# Patient Record
Sex: Female | Born: 1975 | Race: White | Hispanic: No | Marital: Married | Smoking: Never smoker
Health system: Southern US, Community
[De-identification: ages and names within clinical notes are randomized; demographics above are authoritative.]

## PROBLEM LIST (undated history)

## (undated) DIAGNOSIS — E039 Hypothyroidism, unspecified: Secondary | ICD-10-CM

## (undated) DIAGNOSIS — R51 Headache: Secondary | ICD-10-CM

## (undated) DIAGNOSIS — G43009 Migraine without aura, not intractable, without status migrainosus: Principal | ICD-10-CM

## (undated) DIAGNOSIS — N289 Disorder of kidney and ureter, unspecified: Secondary | ICD-10-CM

## (undated) DIAGNOSIS — R519 Headache, unspecified: Secondary | ICD-10-CM

## (undated) HISTORY — DX: Headache, unspecified: R51.9

## (undated) HISTORY — DX: Migraine without aura, not intractable, without status migrainosus: G43.009

## (undated) HISTORY — DX: Headache: R51

## (undated) HISTORY — DX: Hypothyroidism, unspecified: E03.9

---

## 1998-05-29 HISTORY — PX: CHOLECYSTECTOMY: SHX55

## 1999-05-30 HISTORY — PX: TUBAL LIGATION: SHX77

## 2002-03-21 ENCOUNTER — Ambulatory Visit (HOSPITAL_COMMUNITY): Admission: RE | Admit: 2002-03-21 | Discharge: 2002-03-21 | Payer: Self-pay | Admitting: Obstetrics and Gynecology

## 2002-04-28 ENCOUNTER — Other Ambulatory Visit: Admission: RE | Admit: 2002-04-28 | Discharge: 2002-04-28 | Payer: Self-pay | Admitting: Obstetrics & Gynecology

## 2002-11-25 ENCOUNTER — Encounter: Payer: Self-pay | Admitting: Family Medicine

## 2002-11-25 ENCOUNTER — Ambulatory Visit (HOSPITAL_COMMUNITY): Admission: RE | Admit: 2002-11-25 | Discharge: 2002-11-25 | Payer: Self-pay | Admitting: Family Medicine

## 2003-11-08 ENCOUNTER — Emergency Department (HOSPITAL_COMMUNITY): Admission: EM | Admit: 2003-11-08 | Discharge: 2003-11-08 | Payer: Self-pay | Admitting: Emergency Medicine

## 2003-12-24 ENCOUNTER — Ambulatory Visit (HOSPITAL_COMMUNITY): Admission: RE | Admit: 2003-12-24 | Discharge: 2003-12-24 | Payer: Self-pay | Admitting: Family Medicine

## 2004-02-17 ENCOUNTER — Encounter (HOSPITAL_COMMUNITY): Admission: RE | Admit: 2004-02-17 | Discharge: 2004-02-18 | Payer: Self-pay | Admitting: Endocrinology

## 2005-03-15 ENCOUNTER — Ambulatory Visit: Payer: Self-pay | Admitting: Internal Medicine

## 2005-12-12 ENCOUNTER — Ambulatory Visit (HOSPITAL_BASED_OUTPATIENT_CLINIC_OR_DEPARTMENT_OTHER): Admission: RE | Admit: 2005-12-12 | Discharge: 2005-12-12 | Payer: Self-pay | Admitting: Pulmonary Disease

## 2005-12-17 ENCOUNTER — Ambulatory Visit: Payer: Self-pay | Admitting: Internal Medicine

## 2006-03-08 ENCOUNTER — Ambulatory Visit: Payer: Self-pay | Admitting: Internal Medicine

## 2006-03-12 ENCOUNTER — Ambulatory Visit (HOSPITAL_COMMUNITY): Admission: RE | Admit: 2006-03-12 | Discharge: 2006-03-12 | Payer: Self-pay | Admitting: Internal Medicine

## 2006-03-12 ENCOUNTER — Ambulatory Visit: Payer: Self-pay | Admitting: Internal Medicine

## 2006-10-31 ENCOUNTER — Ambulatory Visit (HOSPITAL_COMMUNITY): Admission: RE | Admit: 2006-10-31 | Discharge: 2006-10-31 | Payer: Self-pay | Admitting: Obstetrics & Gynecology

## 2007-06-20 ENCOUNTER — Ambulatory Visit (HOSPITAL_COMMUNITY): Admission: RE | Admit: 2007-06-20 | Discharge: 2007-06-20 | Payer: Self-pay | Admitting: Family Medicine

## 2007-07-07 ENCOUNTER — Emergency Department (HOSPITAL_COMMUNITY): Admission: EM | Admit: 2007-07-07 | Discharge: 2007-07-08 | Payer: Self-pay | Admitting: Emergency Medicine

## 2007-08-26 ENCOUNTER — Other Ambulatory Visit: Admission: RE | Admit: 2007-08-26 | Discharge: 2007-08-26 | Payer: Self-pay | Admitting: Obstetrics & Gynecology

## 2010-06-19 ENCOUNTER — Encounter: Payer: Self-pay | Admitting: Pulmonary Disease

## 2010-10-14 NOTE — H&P (Signed)
Monique Ramsey, Monique Ramsey                             ACCOUNT NO.:  [id number]   MEDICAL RECORD NO.:  [id number]                   PATIENT TYPE:   LOCATION:                                       FACILITY:   PHYSICIAN:  Tilda Burrow, M.D.              DATE OF BIRTH:  [birth year]   DATE OF ADMISSION:  03/21/2002  DATE OF DISCHARGE:                                HISTORY & PHYSICAL   ADMISSION DIAGNOSES:  1. Umbilical hernia with chronic moisture secondary to skin changes.  2. Leukorrhea secondary to cervical erosion.   HISTORY OF PRESENT ILLNESS:  This 35 year old female, gravida 2, para 2,  status post tubal ligation is admitted at this time for umbilical  herniorrhaphy for correction of a small bulging, umbilical hernia which has  persisted since her laparoscopic tubal ligation.  This has resulted in a  forward protrusion of the umbilicus and some skin creases within the  umbilicus which remain chronic irritated and tender.  This appears to be  chronic moisture trapping due to the changes due to the 1 cm umbilical  hernia and protrusion into what was previously an inverted deep naval.   Additionally, the patient is also to have a LEEP excision of everted  cervical tissue due to chronic leukorrhea.  She is having chronic moisture  changes in the introitus since her second child which leads to peroneal  discomfort and vaginal irritation.  The cervical eversion appears to be  secondary to changes associated with vaginal delivery.  The patient is aware  that no absolute guarantees can be given for the resolution of leukorrhea.   PAST MEDICAL HISTORY:  Benign.   PAST SURGICAL HISTORY:  Tubal ligation, laparoscopic cholecystectomy.   ALLERGIES:  No known drug allergies.   HABITS:  Iron and vitamins.   MEDICATIONS:  None.   PHYSICAL EXAMINATION:  VITAL SIGNS:  Height 5 feet 3 inches, weight 138,  blood pressure 130/80, pulse 70. Urine hCG is negative.  GENERAL:  This is an alert,  highly anxious, well-groomed Caucasian female  alert and oriented x3.  HEENT:  Pupils equal, round, and reactive to light.  NECK:  Supple. Trachea midline.  CHEST:  Clear to auscultation.  BREASTS:  Deferred.  ABDOMEN:  Nontender without masses. 1 cm umbilical hernia causing a  protrusion of the tissue from deep within the umbilicus and moisture  trapping.  PELVIC: External genitalia normal female with chronic moisture changes.  Vaginal examination with generous secretions.  Cervix multiparous and  everted.  Uterus normal size, shape, and contour with minimal descent.  Adnexa negative for masses.   ASSESSMENT:  Umbilical hernia and cervical eversion with chronic irritation  secondary to moisture changes.   PLAN:  Umbilical herniorrhaphy, LEEP excision of cervix on March 21, 2002.  Tilda Burrow, M.D.    JVF/MEDQ  D:  03/17/2002  T:  03/17/2002  Job:  [id number]

## 2010-10-14 NOTE — Procedures (Signed)
Monique Ramsey, Monique Ramsey                 ACCOUNT NO.:  [id number]   MEDICAL RECORD NO.:  [id number]          PATIENT TYPE:  OUT   LOCATION:  SLEEP CENTER                 FACILITY:  [facility]   PHYSICIAN:  Clinton D. Maple Hudson, M.D. DATE OF BIRTH:  1975-09-05   DATE OF STUDY:  12/12/2005                              NOCTURNAL POLYSOMNOGRAM   REFERRING PHYSICIAN:  Dr. Kari Baars.   DATE OF STUDY:  December 12, 2005.   INDICATION FOR STUDY:  Hypersomnia with sleep apnea.   EPWORTH SLEEPINESS SCORE:  5/24.   BMI:  26.   WEIGHT:  144 pounds.   MEDICATIONS:  Armour Thyroid.   SLEEP ARCHITECTURE:  Total sleep time 282 minutes with sleep efficiency 64%.  Stage I was 7%, stage II 76%, stages III and IV 10%, REM 7% of total sleep  time.  Sleep latency 75 minutes, REM latency 179 minutes, awake after sleep  onset 86 minutes, arousal index markedly increased at 79.6 indicating  significant sleep fragmentation.  No bedtime medication was taken.  She  estimated that she slept a total of 4 hours and required 2 hours to fall  asleep.  She described sleep quality as restless and compared overall sleep  quality on this study night to her home experience as same as usual..   RESPIRATORY DATA:  Apnea/hypopnea index (AHI, RDI) 0.6 obstructive events  per hour which is within normal limits (normal range 0/5 per hour).  There  were 3 hypopneas.  All events were recorded while sleeping supine.  REM AHI  6.3 per hour.   OXYGEN DATA:  Mild snoring with oxygen desaturation to a nadir of 92%.  Mean  oxygen saturation through the study was 96% on room air.   CARDIAC DATA:  Normal sinus rhythm.   MOVEMENT/PARASOMNIA:  A total of 34 limb jerks were recorded of which 8 were  associated with arousal or awakening for a periodic limb movement with  arousal index of 1.7 per hour which is probably insignificant.  No bathroom  trips.   IMPRESSION/RECOMMENDATIONS:  1.  Short and fragmented sleep with some delay in  initiating sustained sleep      until nearly 12:30 a.m.  Note that she describes overall sleep quality      as same as usual suggesting that management as an insomnia may be of      some benefit.  2.  A few obstructive respiratory events were noted, AHI 0.6 per hour.      These do not appear to be clinically significant and were associated      with mild snoring and normal oxygenation.      Clinton D. Maple Hudson, M.D.  Diplomate, Biomedical engineer of Sleep Medicine  Electronically Signed     CDY/MEDQ  D:  12/17/2005 10:16:46  T:  12/17/2005 22:17:02  Job:  [id number]

## 2010-10-14 NOTE — Op Note (Signed)
Monique Ramsey, Monique Ramsey                 ACCOUNT NO.:  [id number]   MEDICAL RECORD NO.:  [id number]          PATIENT TYPE:  AMB   LOCATION:  DAY                           FACILITY:  [facility]   PHYSICIAN:  Lionel December, M.D.    DATE OF BIRTH:  1975-10-23   DATE OF PROCEDURE:  03/12/2006  DATE OF DISCHARGE:                                 OPERATIVE REPORT   PROCEDURE:  Esophagogastroduodenoscopy with placement of Bravo device for pH  monitoring.   INDICATION:  [name] is 35 year old Caucasian female who has bitter taste  coating on her tongue on waking up as well as nausea and gnawing feeling in  her epigastric area.  She is presumed to have GERD and she has been on  therapy but without symptomatic improvement.  She has had these symptoms for  5 years.  She is undergoing diagnostic EGD.  Unless she has evidence of  complicated esophagitis, we will proceed with pH study with Bravo device.  The procedure and risks were reviewed the patient, informed consent was  obtained.  Please note that she has had cholecystectomy before.   MEDS FOR CONSCIOUS SEDATION:  Benzocaine spray pharyngeal topical  anesthesia, Demerol 50 mg IV, Versed 10 mg IV.   FINDINGS:  Procedure performed in endoscopy suite.  The patient's vital  signs and O2 saturation were monitored during procedure and remained stable.  The patient was placed left lateral position and Olympus videoscope was  passed via oropharynx without any difficulty into esophagus.   Esophagus. Mucosa of the esophagus is normal except there was focal erythema  at GE junction.  GE junction of 33 cm and hiatus was at 35.  She had a 2-cm  long sliding hiatal hernia.   Stomach was empty and distended very well insufflation.  Folds of proximal  stomach were normal.  Examination of mucosa at body, antrum, pyloric channel  as well as angularis, fundus and cardia was normal.   Duodenum. Bulbar mucosa was normal.  Scope was passed to second part of the  duodenum where mucosa and folds were normal.  Endoscope was withdrawn.   We decided to proceed with placement of Bravo device.  It was calibrated.  It was already loaded onto delivery catheter.  It was passed blindly via  oropharynx into esophagus to length of 27 cm from the incisors.  Delivery  catheter was connected to suction device for 30 seconds.  Plunger was pushed  in order to secure the device to esophageal mucosa.  It was then rotated  clockwise and gradually withdrawn.  Endoscope was passed again and Bravo  device was noted to be in good position connected to mucosa.  Pictures taken  for the record.  Endoscope was withdrawn.   The patient tolerated the procedure well.   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  Next small sliding hiatal hernia.   No evidence of peptic ulcer disease or gastritis.   Bravo device placed 6 cm proximal to GE junction.   RECOMMENDATIONS:  She will resume her usual diet and  activity except she  will not drive today.  She will stay off her PPI.  Symptom diary as suggested.  She will return to  this facility in 48 hours.      Lionel December, M.D.  Electronically Signed     NR/MEDQ  D:  03/12/2006  T:  03/13/2006  Job:  [id number]   cc:   Patrica Duel, M.D.  Fax: [phone]   Lazaro Arms, M.D.  Fax: [phone]

## 2010-10-14 NOTE — Op Note (Signed)
Monique Ramsey, Monique Ramsey                             ACCOUNT NO.:  [id number]   MEDICAL RECORD NO.:  [id number]                   PATIENT TYPE:  AMB   LOCATION:  DAY                                  FACILITY:  [facility]   PHYSICIAN:  Tilda Burrow, M.D.              DATE OF BIRTH:  May 12, 1976   DATE OF PROCEDURE:  DATE OF DISCHARGE:  03/21/2002                                 OPERATIVE REPORT   PREOPERATIVE DIAGNOSES:  Umbilical hernia and also cervical eversion.   POSTOPERATIVE DIAGNOSES:  Umbilical hernia and also cervical eversion.   OPERATION/PROCEDURE:  Umbilical herniorrhaphy, LEEP excision of  transformation sounded cervix.   ASSISTANT:  None.   ANESTHESIA:  General.   COMPLICATIONS:  None.   ESTIMATED BLOOD LOSS:  50 cc.   INDICATIONS FOR PROCEDURE:  A 35 year old female with complaints of chronic  moisture capping in the umbilicus resulting from a small protuberant 1 cm  umbilical hernia that had developed after a previous cholecystectomy.  Additionally, the patient has had persistent leukorrhea which was  attributable to her cervical eversion.   DESCRIPTION OF PROCEDURE:  The patient was taken to the operating room and  prepped for an abdominal procedure first.  We then proceeded with a  subumbilical incision circumferential in location which elevated the skin  flap of the umbilicus.  The rim of the fibrous umbilical hernia could be  identified.  The fatty tissue protruding through the hernia defect was  identifiable.  It was reducible without entering the peritoneal cavity.  Alice clamps were placed on the upper and lower aspects of the umbilical  hernia fascial defect, and then the series of permanent interrupted 0  Prolene was placed over the umbilicus to close the fascial defect.  The  subcutaneous tissues were then reapproximated with 4-0 Dexon to invert the  umbilicus into its normal inward position, the skin edges then closed using  subcuticular 4-0 Dexon.  The  patient tolerated the procedure well.   The patient was then repositioned with the legs elevated in the yellow fin  lithotomy stirrups, the speculum inserted and the leg plate confirmed as  being clotted properly.  With the patient already asleep, it was easy to  then proceed with loupe excision of the cervix using a two pass technique,  removing the surface tissues of the cervix anteriorly  and posteriorly.  These were then sent for histologic confirmation.  Monsel  solution was applied to the cervix to complete hemostasis.  The patient  tolerated the procedure well and went to the recovery room in good condition  for outpatient discharge after meeting discharge criteria.  Tilda Burrow, M.D.    JVF/MEDQ  D:  04/27/2002  T:  04/27/2002  Job:  [id number]

## 2010-10-14 NOTE — Op Note (Signed)
NAMEABBEGAYLE, DENAULT                 ACCOUNT NO.:  [id number]   MEDICAL RECORD NO.:  [id number]          PATIENT TYPE:  AMB   LOCATION:  DAY                           FACILITY:  [facility]   PHYSICIAN:  Lionel December, M.D.    DATE OF BIRTH:  1975/09/23   DATE OF PROCEDURE:  DATE OF DISCHARGE:  03/12/2006                                 OPERATIVE REPORT   PROCEDURE:  Esophageal pH monitoring with the Bravo device.   INDICATIONS:  Monique Ramsey is a 35 year old Caucasian female with chronic symptoms  of GERD, who has not responded to therapy.  She has failed Nexium, Aciphex,  Zegerid, and Protonix.  She had esophagogastroduodenoscopy on 03/12/2006  revealing mild changes of reflux esophagitis limited to GE junction and a  small sliding hiatal hernia.  Bravo device was placed at that time.   FINDINGS:  Day one analysis study duration is 23 hours and 5 minutes.  Number of reflux episodes 61, 50 of which occurred in upright position.   Number of reflux episodes greater than 5 minutes is four, three of which  occurred in upright position.   Duration of longest reflux episode is 10 minutes occurring in upright  position.   Time pH less than 4 is 73 minutes.   Fraction time pH less than 4 is 5.3%.   Day two analysis study duration 22 hours and 16 minutes.   Number of reflux episodes 52, 45 of which occurred in upright position.   Number of reflux episodes greater than 5 minutes is five, four of which  occurred in upright position.   Duration of longest reflux episode 15 minutes occurring postprandial.   Time pH below for 58 minutes.   Fraction time pH below 4 is 4.3%.   Combined 2-day analysis.   Number of reflux episodes is 112, 94 which occurred in upright position.   Number of reflux episodes greater than 5 minutes is nine.   Duration of longest reflux episode 15 minutes.   Time pH below 4 is 131 minutes.   Fraction time pH below 4 is 4.8%.   Symptom diary.  The patient only  reported one episode of heartburn but no  acid was documented with her symptom.   FINAL IMPRESSION:  This is an abnormal study.  The patient is experiencing  esophageal acid exposure in abnormal or pathologic range.  However, she did  not write much in her symptom diary.  She only recorded one episode of  heartburn which was not associated with esophageal acid exposure.   RECOMMENDATIONS:  We will try her on Prevacid 30 mg b.i.d. and Reglan at  least for the short duration.  These recommendation will be made when I can  contact the patient (the patient was called but she is not home).      Lionel December, M.D.  Electronically Signed     NR/MEDQ  D:  03/19/2006  T:  03/20/2006  Job:  [id number]   cc:   Patrica Duel, M.D.  Fax: [phone]   Lazaro Arms, M.D.  Fax: [phone]

## 2010-10-14 NOTE — H&P (Signed)
NAME:  Monique Ramsey, Monique Ramsey                 ACCOUNT NO.:  [id number]   MEDICAL RECORD NO.:  [id number]          PATIENT TYPE:  AMB   LOCATION:  DAY                           FACILITY:  [facility]   PHYSICIAN:  Lionel December, M.D.    DATE OF BIRTH:  August 05, 1975   DATE OF ADMISSION:  DATE OF DISCHARGE:  LH                                HISTORY & PHYSICAL   CHIEF COMPLAINT:  Monique Ramsey is a 35 year old Caucasian female who we saw last  year with over a 5 year history of presumed acid reflux.  She wakes up every  morning with nausea and a gnawing type feeling in her stomach.  She also has  a bitter taste in her mouth and is able to scrape a white coating off of her  tongue.  She states she has to eat almost immediately when she wakes up in  order to feel better.  Throughout the day she notices similar symptoms when  her stomach is empty, but it is not as prominent.  She denies any typical  heartburn symptoms, dysphagia, odynophagia.  She has chronic postprandial  fecal urgency several times a week especially when she eats out at [employer]. This is associated with lower abdominal cramping.  She has had  problems with her menstrual cycle with too little flow.  She has had  spotting and is being followed by Dr. Despina Hidden.   CURRENT MEDICATIONS:  1. Multivitamin daily.  2. Thyroid armour 60 mg daily.   ALLERGIES:  NO KNOWN DRUG ALLERGIES.   PAST MEDICAL HISTORY:  Hypothyroidism.   PAST SURGICAL HISTORY:  1. Cholecystectomy during pregnancy at [redacted] weeks gestation for sludge.  2. History of tubal ligation.  3. Umbilical hernia repair in 2003.   FAMILY HISTORY:  Mother and father are both alive and well.  She has a  brother who is alive and well.   SOCIAL HISTORY:  She is married and has two children.  She is a housewife.  Has never been a smoker.  No alcohol use.   REVIEW OF SYSTEMS:  See HPI for GI.  CONSTITUTIONAL:  Her weight is up 7  pounds since we saw her last year.  CARDIOPULMONARY:  No chest  pain or  shortness of breath.  See HPI for GU.   PHYSICAL EXAMINATION:  Weight 149, height 5 foot 3, temperature 98.9, blood  pressure 116/70, pulse 88.  GENERAL:  A pleasant, well-nourished, well-developed Caucasian female in no  acute distress.  SKIN:  Warm and dry.  No jaundice.  HEENT:  Pupils equal, round, reactive to light.  Conjunctiva are pink.  Sclerae nonicteric.  Oropharyngeal mucosa moist and pink.  No lesions,  erythema or exudates.  No lymphadenopathy.  CHEST:  Lungs clear to auscultation.  CARDIAC:  Reveals a regular rate and rhythm.  No murmur, rub or gallop.  ABDOMEN:  Positive bowel sounds, soft, nontender, nondistended.  No  organomegaly or masses.  No rebound tenderness or guarding.  No abdominal  hernias.   IMPRESSION:  Monique Ramsey is a 35 year old lady with over a 5 year history  of  early a.m. nausea, epigastric gnawing with empty stomach, bitter taste in  her mouth especially in the mornings as well as a white coating on her  tongue.  She has been tried with two week courses of various PPI's in the  past including Nexium, Aciphex, Zegerid and Protonix without noted  improvement of her symptoms.  Last year we were planning on EGD with Bravo  placement to help figure out the cause of her symptoms.  She is back now to  have this scheduled.  She also has IBS with mild postprandial symptoms.   PLAN:  EGD with possible Bravo placement off of antacids or PPI therapy in  the near future with Dr. Karilyn Cota.      Tana Coast, P.A.      Lionel December, M.D.  Electronically Signed    LL/MEDQ  D:  03/08/2006  T:  03/09/2006  Job:  [id number]

## 2010-10-26 ENCOUNTER — Other Ambulatory Visit (HOSPITAL_COMMUNITY): Payer: Self-pay | Admitting: Internal Medicine

## 2010-10-26 DIAGNOSIS — R209 Unspecified disturbances of skin sensation: Secondary | ICD-10-CM

## 2010-10-28 ENCOUNTER — Other Ambulatory Visit (HOSPITAL_COMMUNITY): Payer: Self-pay | Admitting: Internal Medicine

## 2010-10-28 ENCOUNTER — Ambulatory Visit (HOSPITAL_COMMUNITY)
Admission: RE | Admit: 2010-10-28 | Discharge: 2010-10-28 | Disposition: A | Discharge status: Home / Self Care | Payer: 59 | Source: Ambulatory Visit | Attending: Internal Medicine | Admitting: Internal Medicine

## 2010-10-28 DIAGNOSIS — R209 Unspecified disturbances of skin sensation: Secondary | ICD-10-CM | POA: Insufficient documentation

## 2010-10-28 DIAGNOSIS — R930 Abnormal findings on diagnostic imaging of skull and head, not elsewhere classified: Secondary | ICD-10-CM | POA: Insufficient documentation

## 2010-10-28 MED ORDER — GADOBENATE DIMEGLUMINE 529 MG/ML IV SOLN: 15.0000 mL | Freq: Once | INTRAVENOUS | Status: AC | PRN

## 2011-02-17 LAB — URINALYSIS, ROUTINE W REFLEX MICROSCOPIC
Glucose, UA: NEGATIVE
Ketones, ur: NEGATIVE
Leukocytes, UA: NEGATIVE
Protein, ur: NEGATIVE
Urobilinogen, UA: 0.2

## 2011-02-17 LAB — URINE MICROSCOPIC-ADD ON

## 2011-02-17 LAB — PREGNANCY, URINE: Preg Test, Ur: NEGATIVE

## 2012-05-29 HISTORY — PX: OTHER SURGICAL HISTORY: SHX169

## 2013-10-29 ENCOUNTER — Other Ambulatory Visit (HOSPITAL_COMMUNITY): Payer: Self-pay | Admitting: Family Medicine

## 2013-10-29 DIAGNOSIS — R1011 Right upper quadrant pain: Secondary | ICD-10-CM

## 2013-10-31 ENCOUNTER — Other Ambulatory Visit (HOSPITAL_COMMUNITY): Payer: 59

## 2013-11-03 ENCOUNTER — Encounter (HOSPITAL_COMMUNITY): Payer: Self-pay

## 2013-11-03 ENCOUNTER — Ambulatory Visit (HOSPITAL_COMMUNITY)
Admission: RE | Admit: 2013-11-03 | Discharge: 2013-11-03 | Disposition: A | Discharge status: Home / Self Care | Payer: BC Managed Care – PPO | Source: Ambulatory Visit | Attending: Family Medicine | Admitting: Family Medicine

## 2013-11-03 DIAGNOSIS — N200 Calculus of kidney: Secondary | ICD-10-CM | POA: Insufficient documentation

## 2013-11-03 DIAGNOSIS — R1011 Right upper quadrant pain: Secondary | ICD-10-CM

## 2013-11-03 DIAGNOSIS — R109 Unspecified abdominal pain: Secondary | ICD-10-CM | POA: Insufficient documentation

## 2013-11-03 MED ORDER — IOHEXOL 300 MG/ML  SOLN
100.0000 mL | Freq: Once | INTRAMUSCULAR | Status: AC | PRN
  Administered 2013-11-03: 100 mL via INTRAVENOUS

## 2014-04-01 ENCOUNTER — Encounter: Payer: Self-pay | Admitting: Neurology

## 2014-04-01 ENCOUNTER — Other Ambulatory Visit: Payer: Self-pay | Admitting: Neurology

## 2014-04-01 ENCOUNTER — Ambulatory Visit (INDEPENDENT_AMBULATORY_CARE_PROVIDER_SITE_OTHER): Payer: BC Managed Care – PPO | Admitting: Neurology

## 2014-04-01 VITALS — BP 129/85 | HR 77 | Ht 62.0 in | Wt 153.8 lb

## 2014-04-01 DIAGNOSIS — G43009 Migraine without aura, not intractable, without status migrainosus: Secondary | ICD-10-CM

## 2014-04-01 DIAGNOSIS — G4710 Hypersomnia, unspecified: Secondary | ICD-10-CM

## 2014-04-01 DIAGNOSIS — G4730 Sleep apnea, unspecified: Secondary | ICD-10-CM

## 2014-04-01 DIAGNOSIS — R9089 Other abnormal findings on diagnostic imaging of central nervous system: Secondary | ICD-10-CM

## 2014-04-01 DIAGNOSIS — R930 Abnormal findings on diagnostic imaging of skull and head, not elsewhere classified: Secondary | ICD-10-CM

## 2014-04-01 HISTORY — DX: Migraine without aura, not intractable, without status migrainosus: G43.009

## 2014-04-01 NOTE — Progress Notes (Signed)
Reason for visit: headache  Monique Ramsey is a 38 y.o. female  History of present illness:  Monique Ramsey is a 38 year old right-handed white female with a history of headaches that began approximately one year ago, but the headaches have become a bit more frequent over the last 2 or 3 months. The headache frequency is about one every 2 weeks, and the patient does not know of any activating factors for the headache. The patient indicates that the headaches are usually in the occipital areas associated with some throbbing sensations, and some nausea without vomiting. The patient denies any visual field changes, and she denies any numbness or weakness of the face, arms, or legs. The patient does have some photophobia without phonophobia. The patient indicates that sleep will help the headache. The headache may last for 4 or 5 hours at a time. The patient does report some decreased cognitive abilities during the headache. She also reports a long-standing history of hypersomnolence, and she indicates that she needs at least 12 hours of sleep a day, but when she wakes up, she still feels sleepy, hot, and has general malaise for 2 or 3 hours. The patient feels fatigued throughout the day. She underwent a sleep study several years ago, but she never slept during the study. The patient has not been told that she snores at night. She is having difficulty staying awake during the day. She reports frequent early morning headaches that will go away within one hour or so after awakening. These headaches are in the frontal areas, not occipital regions. The patient has had MRI evaluation of the brain done in 2012 that showed mild white matter changes. She is sent to this office for an evaluation. There is no family history of hypersomnolence or a history of migraine headaches.  Past Medical History  Diagnosis Date  . Headache   . Hypothyroidism   . 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 Cholecystectomy  2000  . Tubal ligation  2001  . Fallopian tube removal  2014    Family History  Problem Relation Age of Onset  . Multiple sclerosis Mother   . Diabetes Mellitus I Mother   . Migraines Neg Hx     Social history:  reports that she has never smoked. She has never used smokeless tobacco. She reports that she does not drink alcohol or use illicit drugs.  Medications:  No current outpatient prescriptions on file prior to visit.   No current facility-administered medications on file prior to visit.     No Known Allergies  ROS:  Out of a complete 14 system review of symptoms, the patient complains only of the following symptoms, and all other reviewed systems are negative.  Headache Too much sleep Sleepiness   Blood pressure 129/85, pulse 77, height 5\' 2"  (1.575 m), weight 153 lb 12.8 oz (69.763 kg).  Physical Exam  General: The patient is alert and cooperative at the time of the examination.The patient is minimally obese.  Eyes: Pupils are equal, round, and reactive to light. Discs are flat bilaterally.  Neck: The neck is supple, no carotid bruits are noted.  Respiratory: The respiratory examination is clear.  Cardiovascular: The cardiovascular examination reveals a regular rate and rhythm, no obvious murmurs or rubs are noted.  Neuromuscular: Range of movement of the cervical spine is full.  Skin: Extremities are without significant edema.  Neurologic Exam  Mental status: The patient  is alert and oriented x 3 at the time of the examination. The patient has apparent normal recent and remote memory, with an apparently normal attention span and concentration ability.  Cranial nerves: Facial symmetry is present. There is good sensation of the face to pinprick and soft touch bilaterally. The strength of the facial muscles and the muscles to head turning and shoulder shrug are normal bilaterally. Speech is well  enunciated, no aphasia or dysarthria is noted. Extraocular movements are full. Visual fields are full. The tongue is midline, and the patient has symmetric elevation of the soft palate. No obvious hearing deficits are noted.  Motor: The motor testing reveals 5 over 5 strength of all 4 extremities. Good symmetric motor tone is noted throughout.  Sensory: Sensory testing is intact to pinprick, soft touch, vibration sensation, and position sense on all 4 extremities. No evidence of extinction is noted.  Coordination: Cerebellar testing reveals good finger-nose-finger and heel-to-shin bilaterally.  Gait and station: Gait is normal. Tandem gait is normal. Romberg is negative. No drift is seen.  Reflexes: Deep tendon reflexes are symmetric and normal bilaterally. Toes are downgoing bilaterally.   MRI brain 10/28/10:  IMPRESSION: Mild but abnormal cerebral white matter signal without enhancement, predominately in the left hemisphere. This is nonspecific. In this age group primary considerations are demyelinating disease (including multiple sclerosis), sequelae of trauma, hypercoagulable state, vasculitis, migraines, or infection (including Lyme disease).   Assessment/Plan:  1. Migraine headache, common migraine  2. Reported hypersomnolence  3. History of abnormal MRI of the brain  The patient has been given a prescription for Imitrex, and this is a reasonable approach to her current headaches. The headaches are occurring only once or twice a month, and there is no indication for a prophylactic medication for the headache. The patient is reporting a long-standing history of hypersomnolence with early morning headaches that could represent sleep apnea. The patient will be sent for another sleep study. She indicates that she had difficulty getting to sleep with the prior study done several years ago. The patient will be sent for a repeat MRI of the brain to compare to the prior study done in  2012 to follow-up for the white matter lesions seen previously. The patient will follow-up in 4 months.  Monique Ramsey. Monique Dede Dobesh MD 04/01/2014 7:09 PM  [facility] [address] [place] [id number]  Phone [phone] Fax [phone]

## 2014-04-01 NOTE — Patient Instructions (Signed)

## 2014-04-06 ENCOUNTER — Telehealth: Payer: Self-pay | Admitting: Neurology

## 2014-04-06 NOTE — Telephone Encounter (Signed)
Sleep study request review: This patient has an underlying medical history of migraine headaches, hypothyroidism and overweight state and is referred by Dr. Anne HahnWillis for an attended sleep study due to a report of a several year history of hypersomnolence, needing more than 12 hours of sleep and not waking up rested. His note indicates that she had a prior sleep study done which was nonrevealing. I would like to see the patient in clinic before ordering a sleep study to get more sleep related information and see if we are dealing with a hypersomnolence disorder, rather than a sleep disordered breathing disorder. Snoring was not reported in his note. Please arrange for a sleep clinic appointment.    Monique FoleySaima Jenalyn Girdner, MD, PhD [facility])

## 2014-04-06 NOTE — Telephone Encounter (Signed)
Dr. Anne HahnWillis, refers patient for attended sleep study.  Height: 5'2"  Weight: 153 lbs 12.8 oz  BMI: 28.12  Past Medical History:  Headache    . Hypothyroidism   . Common migraine without intractability 04/01/2014     Sleep Symptoms: She also reports a long-standing history of hypersomnolence, and she indicates that she needs at least 12 hours of sleep a day, but when she wakes up, she still feels sleepy, hot, and has general malaise for 2 or 3 hours. The patient feels fatigued throughout the day. She underwent a sleep study several years ago, but she never slept during the study. The patient has not been told that she snores at night. She is having difficulty staying awake during the day. She reports frequent early morning headaches that will go away within one hour or so after awakening.    Epworth Score: Unable to reach the patient    Medication:  Ergocalciferol (Cap) DRISDOL 1610950000 UNITS 50,000 Units once a week.       Levothyroxine Sodium (Tab) SYNTHROID 75 MCG 75 mcg daily.      SUMAtriptan Succinate (Tab) IMITREX 50 MG 50 mg as needed. Maximum dose is 200mg  daily      Ins: [employer]   Assessment & Plan:   1. Migraine headache, common migraine  2. Reported hypersomnolence  3. History of abnormal MRI of the brain  The patient has been given a prescription for Imitrex, and this is a reasonable approach to her current headaches. The headaches are occurring only once or twice a month, and there is no indication for a prophylactic medication for the headache. The patient is reporting a long-standing history of hypersomnolence with early morning headaches that could represent sleep apnea. The patient will be sent for another sleep study. She indicates that she had difficulty getting to sleep with the prior study done several years ago. The patient will be sent for a repeat MRI of the brain to compare to the prior study done in 2012 to follow-up for the white matter lesions  seen previously. The patient will follow-up in 4 months.  Please review patient information and submit instructions for scheduling and orders for sleep technologist. Thank you.

## 2014-04-07 DIAGNOSIS — R51 Headache: Secondary | ICD-10-CM

## 2014-04-10 ENCOUNTER — Telehealth: Payer: Self-pay | Admitting: Neurology

## 2014-04-10 ENCOUNTER — Other Ambulatory Visit: Payer: Self-pay | Admitting: Neurology

## 2014-04-10 DIAGNOSIS — R9089 Other abnormal findings on diagnostic imaging of central nervous system: Secondary | ICD-10-CM

## 2014-04-10 DIAGNOSIS — G4710 Hypersomnia, unspecified: Secondary | ICD-10-CM

## 2014-04-10 DIAGNOSIS — G4730 Sleep apnea, unspecified: Secondary | ICD-10-CM

## 2014-04-10 DIAGNOSIS — G43009 Migraine without aura, not intractable, without status migrainosus: Secondary | ICD-10-CM

## 2014-04-10 NOTE — Telephone Encounter (Signed)
I called the patient. The MRI of the brain is stable from 6/12.   MRI brain 04/10/14:  IMPRESSION: Slightly abnormal MRI scan of the brain showing subtle left frontal periventricular white matter hyperintensities with differential discussed above. No enhancing lesions are noted. Overall no significant change compared with prior MRI scan dated 10/28/2010

## 2014-07-06 ENCOUNTER — Ambulatory Visit (INDEPENDENT_AMBULATORY_CARE_PROVIDER_SITE_OTHER): Payer: BLUE CROSS/BLUE SHIELD | Admitting: Neurology

## 2014-07-06 ENCOUNTER — Encounter: Payer: Self-pay | Admitting: Neurology

## 2014-07-06 VITALS — BP 113/82 | HR 87 | Temp 99.3°F | Ht 62.0 in | Wt 152.0 lb

## 2014-07-06 DIAGNOSIS — G478 Other sleep disorders: Secondary | ICD-10-CM

## 2014-07-06 DIAGNOSIS — G4710 Hypersomnia, unspecified: Secondary | ICD-10-CM

## 2014-07-06 DIAGNOSIS — G479 Sleep disorder, unspecified: Secondary | ICD-10-CM

## 2014-07-06 DIAGNOSIS — R51 Headache: Secondary | ICD-10-CM

## 2014-07-06 DIAGNOSIS — R519 Headache, unspecified: Secondary | ICD-10-CM

## 2014-07-06 DIAGNOSIS — G43009 Migraine without aura, not intractable, without status migrainosus: Secondary | ICD-10-CM

## 2014-07-06 NOTE — Patient Instructions (Signed)
We will do sleep testing for your excessive sleep at night. We will do a night time sleep study followed by a daytime sleep test, for scheduled nap testing.

## 2014-07-06 NOTE — Progress Notes (Signed)
Subjective:    Patient ID: Monique Ramsey is a 39 y.o. female.  HPI     Huston Foley, MD, PhD [facility] [address], [address] [address] [place], [place] [address]  Dear Monique Ramsey,   I saw your patient, Monique Ramsey, upon your kind request in my clinic today for initial consultation of her sleep disorder, in particular her excessive daytime somnolence. The patient is unaccompanied today. As you know, Monique Ramsey is a 39 year old right-handed woman with an underlying medical history of hypothyroidism, vitamin D deficiency, migraine headaches, and overweight state, who reports a long-standing history of sleeping excessively at night. Left her own devices she would easily sleep 12 hours or more each night if she could. She still doesn't wake up rested. Usually she goes to bed around 10 PM and falls asleep within 15 minutes. She is a restless sleeper. She denies restless leg symptoms and frank leg twitching in her sleep. She is not aware of any significant snoring but has woken herself up with a sense of gasping for air. Her husband has never noted any pauses in her breathing. She has no family history of narcolepsy or obstructive sleep apnea. She denies any cataplexy, sleep paralysis, hypnagogic or hypnopompic hallucinations, and denies any parasomnias. She is tired during the day. She's not in the habit of taking any naps. She has never fallen asleep while driving. She denies any sleep attacks during the day. Her biggest complaint is that she sleeps too much at night and never wakes up rested. She denies nocturia but has morning headaches on occasion. Her Epworth sleepiness score is 10 out of 24. She denies depression and is not on any anxiety medication or antidepressant. She has been on prescription strength vitamin D, Synthroid and Imitrex as needed for her migraines. She had a sleep study some 7-10 years ago and says that she was told she did not sleep enough at night. She did not  have a nap study at the time. She drinks 2 cans of soda per day. She does not smoke or drink alcohol.  Her Past Medical History Is Significant For: Past Medical History  Diagnosis Date  . Headache   . Hypothyroidism   . Common migraine without intractability 04/01/2014    Her Past Surgical History Is Significant For: Past Surgical History  Procedure Laterality Date  . Cholecystectomy  2000  . Tubal ligation  2001  . Fallopian tube removal  2014    Her Family History Is Significant For: Family History  Problem Relation Age of Onset  . Multiple sclerosis Mother   . Diabetes Mellitus I Mother   . Migraines Neg Hx     Her Social History Is Significant For: History   Social History  . Marital Status: Married    Spouse Name: [name]    Number of Children: 2  . Years of Education: 12   Occupational History  .      not employed   Social History Main Topics  . Smoking status: Never Smoker   . Smokeless tobacco: Never Used  . Alcohol Use: No  . Drug Use: No  . Sexual Activity: None   Other Topics Concern  . None   Social History Narrative   Patient drinks 2-3 caffeine sodas a day.     Her Allergies Are:  No Known Allergies:   Her Current Medications Are:  Outpatient Encounter Prescriptions as of 07/06/2014  Medication Sig  . SUMAtriptan (IMITREX) 50 MG tablet  50 mg as needed. Maximum dose is 200mg  daily  . SYNTHROID 75 MCG tablet 75 mcg daily.  . Vitamin D, Ergocalciferol, (DRISDOL) 50000 UNITS CAPS capsule 50,000 Units once a week.  :  Review of Systems:  Out of a complete 14 point review of systems, all are reviewed and negative with the exception of these symptoms as listed below:   Review of Systems Too much sleep, non-restorative sleep, morning headaches.   Objective:  Neurologic Exam  Physical Exam Physical Examination:   Filed Vitals:   07/06/14 1430  BP: 113/82  Pulse: 87  Temp: 99.3 F (37.4 C)    General Examination: The patient is a very  pleasant 39 y.o. female in no acute distress. She appears well-developed and well-nourished and very well groomed.   HEENT: Normocephalic, atraumatic, pupils are equal, round and reactive to light and accommodation. Funduscopic exam is normal with sharp disc margins noted. Extraocular tracking is good without limitation to gaze excursion or nystagmus noted. Normal smooth pursuit is noted. Hearing is grossly intact. Tympanic membranes are clear bilaterally. Face is symmetric with normal facial animation and normal facial sensation. Speech is clear with no dysarthria noted. There is no hypophonia. There is no lip, neck/head, jaw or voice tremor. Neck is supple with full range of passive and active motion. There are no carotid bruits on auscultation. Oropharynx exam reveals: mild mouth dryness, good dental hygiene and mild airway crowding, due to narrow airway entry and tonsils in place. She has mild pharyngeal erythema. Mallampati is class II. Neck circumference is 13 inches.  Chest: Clear to auscultation without wheezing, rhonchi or crackles noted.  Heart: S1+S2+0, regular and normal without murmurs, rubs or gallops noted.   Abdomen: Soft, non-tender and non-distended with normal bowel sounds appreciated on auscultation.  Extremities: There is no pitting edema in the distal lower extremities bilaterally. Pedal pulses are intact.  Skin: Warm and dry without trophic changes noted. There are no varicose veins.  Musculoskeletal: exam reveals no obvious joint deformities, tenderness or joint swelling or erythema.   Neurologically:  Mental status: The patient is awake, alert and oriented in all 4 spheres. Her immediate and remote memory, attention, language skills and fund of knowledge are appropriate. There is no evidence of aphasia, agnosia, apraxia or anomia. Speech is clear with normal prosody and enunciation. Thought process is linear. Mood is normal and affect is normal.  Cranial nerves II - XII are  as described above under HEENT exam. In addition: shoulder shrug is normal with equal shoulder height noted. Motor exam: Normal bulk, strength and tone is noted. There is no drift, tremor or rebound. Romberg is negative. Reflexes are 2+ throughout. Babinski: Toes are flexor bilaterally. Fine motor skills and coordination: intact with normal finger taps, normal hand movements, normal rapid alternating patting, normal foot taps and normal foot agility.  Cerebellar testing: No dysmetria or intention tremor on finger to nose testing. Heel to shin is unremarkable bilaterally. There is no truncal or gait ataxia.  Sensory exam: intact to light touch, pinprick, vibration, temperature sense in the upper and lower extremities.  Gait, station and balance: She stands easily. No veering to one side is noted. No leaning to one side is noted. Posture is age-appropriate and stance is narrow based. Gait shows normal stride length and normal pace. No problems turning are noted. She turns en bloc. Tandem walk is unremarkable.               Assessment and Plan:  In summary, ANAIYAH ANGLEMYER is a very pleasant 39 y.o.-year old female with an underlying medical history of hypothyroidism, vitamin D deficiency, migraine headaches, and overweight state, who reports a long-standing history of sleeping excessively at night. Her history is suggestive of a sleepiness or hypersomnolence disorder but not telltale for narcolepsy. Nevertheless, she can sleep 12 hours or more each night but never wakes up rested. She has had these problems for at least 7 or maybe 10 years. Her physical exam is nonfocal and she was reassured in that regard.  At this juncture I would like for the patient  come back for further testing in the form of nocturnal polysomnogram followed by a nap study the next day. I explained her sleep test procedures to her in detail. She does not have to wean any medications at this time. I will see her back after the sleep studies  are completed. I answered all her questions today and the patient was in agreement.  Thank you very much for allowing me to participate in the care of this nice patient. If I can be of any further assistance to you please do not hesitate to talk to me.   Sincerely,   Huston Foley, MD, PhD

## 2014-07-24 ENCOUNTER — Encounter: Payer: Self-pay | Admitting: *Deleted

## 2014-07-24 ENCOUNTER — Telehealth: Payer: Self-pay | Admitting: *Deleted

## 2014-07-24 NOTE — Telephone Encounter (Signed)
called several times to try and reschedule the appointment for the patient but no answer can appointment and sent letter to notify

## 2014-08-03 ENCOUNTER — Ambulatory Visit: Payer: BC Managed Care – PPO | Admitting: Adult Health

## 2014-10-27 ENCOUNTER — Ambulatory Visit: Payer: Self-pay | Admitting: Urology

## 2015-08-02 ENCOUNTER — Other Ambulatory Visit (HOSPITAL_COMMUNITY): Payer: Self-pay | Admitting: Pulmonary Disease

## 2015-08-02 ENCOUNTER — Ambulatory Visit (HOSPITAL_COMMUNITY)
Admission: RE | Admit: 2015-08-02 | Discharge: 2015-08-02 | Disposition: A | Discharge status: Home / Self Care | Payer: BLUE CROSS/BLUE SHIELD | Source: Ambulatory Visit | Attending: Pulmonary Disease | Admitting: Pulmonary Disease

## 2015-08-02 DIAGNOSIS — R05 Cough: Secondary | ICD-10-CM

## 2015-08-02 DIAGNOSIS — R059 Cough, unspecified: Secondary | ICD-10-CM

## 2015-12-13 ENCOUNTER — Encounter (HOSPITAL_COMMUNITY): Payer: Self-pay | Admitting: *Deleted

## 2015-12-13 ENCOUNTER — Emergency Department (HOSPITAL_COMMUNITY)
Admission: EM | Admit: 2015-12-13 | Discharge: 2015-12-13 | Disposition: A | Discharge status: Home / Self Care | Payer: BLUE CROSS/BLUE SHIELD | Attending: Emergency Medicine | Admitting: Emergency Medicine

## 2015-12-13 DIAGNOSIS — S61219A Laceration without foreign body of unspecified finger without damage to nail, initial encounter: Secondary | ICD-10-CM

## 2015-12-13 DIAGNOSIS — Y9389 Activity, other specified: Secondary | ICD-10-CM | POA: Diagnosis not present

## 2015-12-13 DIAGNOSIS — S61211A Laceration without foreign body of left index finger without damage to nail, initial encounter: Secondary | ICD-10-CM | POA: Insufficient documentation

## 2015-12-13 DIAGNOSIS — W293XXA Contact with powered garden and outdoor hand tools and machinery, initial encounter: Secondary | ICD-10-CM | POA: Diagnosis not present

## 2015-12-13 DIAGNOSIS — Y929 Unspecified place or not applicable: Secondary | ICD-10-CM | POA: Insufficient documentation

## 2015-12-13 DIAGNOSIS — E039 Hypothyroidism, unspecified: Secondary | ICD-10-CM | POA: Insufficient documentation

## 2015-12-13 DIAGNOSIS — Y999 Unspecified external cause status: Secondary | ICD-10-CM | POA: Insufficient documentation

## 2015-12-13 MED ORDER — TETANUS-DIPHTH-ACELL PERTUSSIS 5-2.5-18.5 LF-MCG/0.5 IM SUSP
0.5000 mL | Freq: Once | INTRAMUSCULAR | Status: AC
  Administered 2015-12-13: 0.5 mL via INTRAMUSCULAR
  Filled 2015-12-13: qty 0.5

## 2015-12-13 NOTE — ED Notes (Signed)
Pt states she cut her left index finger with electric shears x one hour ago; pt states she feels nauseous and states like she is going to pass out; no active bleeding at this time

## 2015-12-13 NOTE — Discharge Instructions (Signed)
Sterile Tape Wound Care °Some cuts and wounds can be closed using sterile tape, also called skin adhesive strips. Skin adhesive strips can be used for shallow (superficial) and simple cuts, wounds, lacerations, and surgical incisions. These strips act in place of stitches to hold the edges of the wound together, allowing for faster healing. Unlike stitches, the adhesive strips do not require needles or anesthetic medicine for placement. The strips will wear off naturally as the wound is healing. It is important to take proper care of your wound at home while it heals.  °HOME CARE INSTRUCTIONS °· Try to keep the area around your wound clean and dry. Do not allow the adhesive strips to get wet for the first 12 hours.   °· Do not use any soaps or ointments on the wound for the first 12 hours.   °· If a bandage (dressing) has been applied, follow your health care provider's instructions for how often to change the dressing. Keep the dressing dry if one has been applied.   °· Do not remove the adhesive strips. They will fall off on their own. If they do not, you may remove them gently after 10 days. You should gently wet the strips before removing them. For example, this can be done in the shower. °· Do not scratch, pick, or rub the wound area.   °· Protect the wound from further injury until it is healed.   °· Protect the wound from sun and tanning bed exposure while it is healing and for several weeks after healing.   °· Only take over-the-counter or prescription medicines as directed by your health care provider.   °· Keep all follow-up appointments as directed by your health care provider.   °SEEK MEDICAL CARE IF: °Your adhesive strips become wet or soaked with blood before the wound has healed. The tape will need to be replaced.  °SEEK IMMEDIATE MEDICAL CARE IF: °· You have increasing pain in the wound.   °· You develop a rash after the strips are applied. °· Your wound becomes red, swollen, hot, or tender.   °· You  have a red streak that goes away from the wound.   °· You have pus coming from the wound.   °· You have increased bleeding from the wound. °· You notice a bad smell coming from the wound.   °· Your wound breaks open. °MAKE SURE YOU: °· Understand these instructions. °· Will watch your condition. °· Will get help right away if you are not doing well or get worse. °  °This information is not intended to replace advice given to you by your health care provider. Make sure you discuss any questions you have with your health care provider. °  °Document Released: 06/22/2004 Document Revised: 06/05/2014 Document Reviewed: 12/04/2012 °Elsevier Interactive Patient Education ©2016 [employer] ° °

## 2015-12-15 NOTE — ED Provider Notes (Signed)
CSN: [id number]     Arrival date & time 12/13/15  1951 History   First MD Initiated Contact with Patient 12/13/15 2048     Chief Complaint  Patient presents with  . Extremity Laceration     (Consider location/radiation/quality/duration/timing/severity/associated sxs/prior Treatment) HPI  Monique Ramsey is a [age] y.o. female who presents to the Emergency Department complaining of laceration to the tip of her left index finger that occurred one hour prior to arrival.  She was using electric hedge trimmers when the incident occurred.  She c/o pain to the tip of her finger.  Bleeding controlled with pressure prior to arrival.  She denies numbness, difficulty moving the finger and swelling.  No blood thinners.  Last Td is unknown.   Past Medical History  Diagnosis Date  . Headache   . Hypothyroidism   . Common migraine without intractability 04/01/2014   Past Surgical History  Procedure Laterality Date  . Cholecystectomy  2000  . Tubal ligation  2001  . Fallopian tube removal  2014   Family History  Problem Relation Age of Onset  . Multiple sclerosis Mother   . Diabetes Mellitus I Mother   . Migraines Neg Hx    Social History  Substance Use Topics  . Smoking status: Never Smoker   . Smokeless tobacco: Never Used  . Alcohol Use: No   OB History    No data available     Review of Systems  Constitutional: Negative for fever and chills.  Musculoskeletal: Negative for back pain, joint swelling and arthralgias.  Skin: Positive for wound.       Laceration   Neurological: Negative for dizziness, weakness and numbness.  Hematological: Does not bruise/bleed easily.  All other systems reviewed and are negative.     Allergies  Review of patient's allergies indicates no known allergies.  Home Medications   Prior to Admission medications   Medication Sig Start Date End Date Taking? Authorizing Provider  SUMAtriptan (IMITREX) 50 MG tablet 50 mg as needed. Maximum dose is 200mg   daily 03/23/14   Historical Provider, MD  SYNTHROID 75 MCG tablet 75 mcg daily. 03/01/14   Historical Provider, MD  Vitamin D, Ergocalciferol, (DRISDOL) 50000 UNITS CAPS capsule 50,000 Units once a week. 02/22/14   Historical Provider, MD   BP 97/71 mmHg  Pulse 78  Temp(Src) 98.5 F (36.9 C) (Oral)  Resp 16  Ht 5\' 3"  (1.6 m)  Wt 68.04 kg  BMI 26.58 kg/m2  SpO2 100% Physical Exam  Constitutional: She is oriented to person, place, and time. She appears well-developed and well-nourished. No distress.  HENT:  Head: Normocephalic and atraumatic.  Cardiovascular: Normal rate, regular rhythm and intact distal pulses.   Pulmonary/Chest: Effort normal and breath sounds normal. No respiratory distress.  Musculoskeletal: Normal range of motion. She exhibits no edema.       Left hand: She exhibits tenderness and laceration. She exhibits normal range of motion, no bony tenderness, normal two-point discrimination, normal capillary refill and no swelling. Normal sensation noted. Normal strength noted.       Hands: Irregular shaped superficial laceration to the distal tip of left index finger.  Bleeding controlled.  No edema.  No bony tenderness.  Neurological: She is alert and oriented to person, place, and time. She exhibits normal muscle tone. Coordination normal.  Skin: Skin is warm.  Nursing note and vitals reviewed.   ED Course  Procedures (including critical care time) Labs Review Labs Reviewed - No data to display  Imaging Review No results found. I have personally reviewed and evaluated these images and lab results as part of my medical decision-making.   EKG Interpretation None      MDM   Final diagnoses:  Finger laceration, initial encounter    Pt with superficial laceration to the distal tip of the left index finger.  Bleeding controlled.    NV intact, full ROM.  Td updated.  Wound clean, bandaged and steri-strips applied. Wound care instructions given.     Pauline Aus, PA-C 12/15/15 2241  Loren Racer, MD 12/16/15 [phone]

## 2016-06-08 ENCOUNTER — Emergency Department (HOSPITAL_COMMUNITY): Payer: Managed Care, Other (non HMO)

## 2016-06-08 ENCOUNTER — Emergency Department (HOSPITAL_COMMUNITY)
Admission: EM | Admit: 2016-06-08 | Discharge: 2016-06-08 | Disposition: A | Discharge status: Home / Self Care | Payer: Managed Care, Other (non HMO) | Attending: Emergency Medicine | Admitting: Emergency Medicine

## 2016-06-08 ENCOUNTER — Encounter (HOSPITAL_COMMUNITY): Payer: Self-pay | Admitting: *Deleted

## 2016-06-08 DIAGNOSIS — Z79899 Other long term (current) drug therapy: Secondary | ICD-10-CM | POA: Diagnosis not present

## 2016-06-08 DIAGNOSIS — R109 Unspecified abdominal pain: Secondary | ICD-10-CM

## 2016-06-08 DIAGNOSIS — M545 Low back pain: Secondary | ICD-10-CM | POA: Insufficient documentation

## 2016-06-08 DIAGNOSIS — E039 Hypothyroidism, unspecified: Secondary | ICD-10-CM | POA: Diagnosis not present

## 2016-06-08 HISTORY — DX: Disorder of kidney and ureter, unspecified: N28.9

## 2016-06-08 LAB — URINALYSIS, ROUTINE W REFLEX MICROSCOPIC
BILIRUBIN URINE: NEGATIVE
Glucose, UA: NEGATIVE mg/dL
HGB URINE DIPSTICK: NEGATIVE
Ketones, ur: 5 mg/dL — AB
Leukocytes, UA: NEGATIVE
Nitrite: NEGATIVE
PH: 6 (ref 5.0–8.0)
Protein, ur: NEGATIVE mg/dL
SPECIFIC GRAVITY, URINE: 1.011 (ref 1.005–1.030)

## 2016-06-08 LAB — PREGNANCY, URINE: Preg Test, Ur: NEGATIVE

## 2016-06-08 MED ORDER — CYCLOBENZAPRINE HCL 10 MG PO TABS: 10.0000 mg | ORAL_TABLET | Freq: Three times a day (TID) | ORAL | 0 refills | Status: AC | PRN

## 2016-06-08 MED ORDER — ONDANSETRON 8 MG PO TBDP
8.0000 mg | ORAL_TABLET | Freq: Once | ORAL | Status: AC
  Administered 2016-06-08: 8 mg via ORAL
  Filled 2016-06-08: qty 1

## 2016-06-08 MED ORDER — NAPROXEN 500 MG PO TABS: 500.0000 mg | ORAL_TABLET | Freq: Two times a day (BID) | ORAL | 0 refills | Status: AC

## 2016-06-08 MED ORDER — OXYCODONE-ACETAMINOPHEN 5-325 MG PO TABS
1.0000 | ORAL_TABLET | Freq: Once | ORAL | Status: AC
  Administered 2016-06-08: 1 via ORAL
  Filled 2016-06-08: qty 1

## 2016-06-08 NOTE — Discharge Instructions (Signed)
Alternate ice and heat to your back.  Follow-up with your doctor for recheck.  Return to ER for any worsening symptoms

## 2016-06-08 NOTE — ED Notes (Signed)
Patient transported to CT 

## 2016-06-08 NOTE — ED Triage Notes (Signed)
Pt comes in with right sided flank pain starting 2 days ago. Denies any urinary symptoms. Has hx of kidney stones. Denies n/v/d.

## 2016-06-08 NOTE — ED Notes (Signed)
POC urine pregnancy cancelled per [place]am[place], PA

## 2016-06-12 NOTE — ED Provider Notes (Signed)
[facility]Y DEPT Provider Note   CSN: [id number] Arrival date & time: 06/08/16  1827     History   Chief Complaint Chief Complaint  Patient presents with  . Flank Pain    right    HPI Monique Ramsey is a 41 y.o. female.  HPI   Monique Ramsey is a 41 y.o. female who presents to the Emergency Department complaining of right flank pain for 2 days.  She describes a aching pain that is worse with movement.  Has hx of kidney stones but states pain is not as intense.  She has been taking OTC pain relievers with minimal improvement.  She denies dysuria, hematuria, fever, chills, abdominal pain, shortness of breath,  vaginal bleeding or discharge.     Past Medical History:  Diagnosis Date  . 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 Headache   . Hypothyroidism   . Renal disorder    kidney stones    Patient Active Problem List   Diagnosis Date Noted  . 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 CHOLECYSTECTOMY  2000  . fallopian tube removal  2014  . TUBAL LIGATION  2001    OB History    No data available       Home Medications    Prior to Admission medications   Medication Sig Start Date End Date Taking? Authorizing Provider  SYNTHROID 88 MCG tablet Take 8 mcg by mouth daily. 05/15/16  Yes Historical Provider, MD  Vitamin D, Ergocalciferol, (DRISDOL) 50000 UNITS CAPS capsule 50,000 Units once a week. 02/22/14  Yes Historical Provider, MD  cyclobenzaprine (FLEXERIL) 10 MG tablet Take 1 tablet (10 mg total) by mouth 3 (three) times daily as needed. 06/08/16   Maliha Outten, PA-C  naproxen (NAPROSYN) 500 MG tablet Take 1 tablet (500 mg total) by mouth 2 (two) times daily with a meal. 06/08/16   Emaley Applin, PA-C    Family History Family History  Problem Relation Age of Onset  . Multiple sclerosis Mother   . Diabetes Mellitus I Mother   . Migraines Neg Hx     Social History Social  History  Substance Use Topics  . Smoking status: Never Smoker  . Smokeless tobacco: Never Used  . Alcohol use No     Allergies   Patient has no known allergies.   Review of Systems Review of Systems  Constitutional: Negative for fever.  Respiratory: Negative for shortness of breath.   Gastrointestinal: Negative for abdominal pain, constipation and vomiting.  Genitourinary: Positive for flank pain. Negative for decreased urine volume, difficulty urinating, dysuria, hematuria, pelvic pain, vaginal bleeding, vaginal discharge and vaginal pain.  Musculoskeletal: Positive for back pain. Negative for joint swelling.  Skin: Negative for rash.  Neurological: Negative for weakness and numbness.  All other systems reviewed and are negative.    Physical Exam Updated Vital Signs BP 117/70 (BP Location: Right Arm)   Pulse 88   Temp 98.5 F (36.9 C) (Oral)   Resp 17   Ht 5\' 3"  (1.6 m)   Wt 66.7 kg   SpO2 97%   BMI 26.04 kg/m   Physical Exam  Constitutional: She is oriented to person, place, and time. She appears well-developed and well-nourished. No distress.  HENT:  Head: Normocephalic and atraumatic.  Mouth/Throat: Oropharynx is clear and moist.  Neck: Normal range of motion. Neck supple.  Cardiovascular: Normal rate, regular rhythm and intact distal pulses.   No  murmur heard. Pulmonary/Chest: Effort normal and breath sounds normal. No respiratory distress.  Abdominal: Soft. Normal appearance. She exhibits no distension and no mass. There is no tenderness. There is no rebound, no guarding and no tenderness at McBurney's point.  Musculoskeletal: She exhibits tenderness. She exhibits no edema.       Lumbar back: She exhibits tenderness and pain. She exhibits normal range of motion, no swelling, no deformity, no laceration and normal pulse.  Focal tenderness of the right flank.  Pain reproduced with palpation.  Neg SLR bilaterally   Neurological: She is alert and oriented to  person, place, and time. She has normal strength. No sensory deficit. She exhibits normal muscle tone. Coordination and gait normal.  Skin: Skin is warm and dry. No rash noted.  Nursing note and vitals reviewed.    ED Treatments / Results  Labs (all labs ordered are listed, but only abnormal results are displayed) Labs Reviewed  URINALYSIS, ROUTINE W REFLEX MICROSCOPIC - Abnormal; Notable for the following:       Result Value   Ketones, ur 5 (*)    All other components within normal limits  PREGNANCY, URINE  POC URINE PREG, ED    EKG  EKG Interpretation None       Radiology Ct Renal Stone Study  Result Date: 06/08/2016 CLINICAL DATA:  Right-sided flank pain starting 2 days ago. EXAM: CT ABDOMEN AND PELVIS WITHOUT CONTRAST TECHNIQUE: Multidetector CT imaging of the abdomen and pelvis was performed following the standard protocol without IV contrast. COMPARISON:  11/03/2013 FINDINGS: Lower chest: No acute abnormality.  Small hiatal hernia. Hepatobiliary: No focal liver abnormality is seen. Status post cholecystectomy. No biliary dilatation. Pancreas: Unremarkable. No pancreatic ductal dilatation or surrounding inflammatory changes. Spleen: Normal in size without focal abnormality. Adrenals/Urinary Tract: Normal appearance of the adrenal glands. Normal cortical thickness of the kidneys. There are bilateral nonobstructive tiny renal calculi, the largest of which in the upper pole of the right kidney measures 3 mm. No evidence of hydronephrosis. Stomach/Bowel: Stomach is within normal limits. No evidence of appendicitis. No evidence of bowel wall thickening, distention, or inflammatory changes. Vascular/Lymphatic: No significant vascular findings are present. No enlarged abdominal or pelvic lymph nodes. Reproductive: No evidence of adnexal masses. Ill-defined hypoattenuated area within the lower uterine segment. Other: No abdominal wall hernia or abnormality. No abdominopelvic ascites.  Musculoskeletal: No acute or significant osseous findings. IMPRESSION: No evidence obstructive uropathy. Bilateral nonobstructive nephrolithiasis. Ill-defined hypoattenuated area within the lower uterine segment, which may be further evaluated with pelvic ultrasound. Electronically Signed   By: Ted Mcalpineobrinka  Dimitrova M.D.   On: 06/08/2016 21:56     Procedures Procedures (including critical care time)  Medications Ordered in ED Medications  oxyCODONE-acetaminophen (PERCOCET/ROXICET) 5-325 MG per tablet 1 tablet (1 tablet Oral Given 06/08/16 2043)  ondansetron (ZOFRAN-ODT) disintegrating tablet 8 mg (8 mg Oral Given 06/08/16 2225)     Initial Impression / Assessment and Plan / ED Course  I have reviewed the triage vital signs and the nursing notes.  Pertinent labs & imaging results that were available during my care of the patient were reviewed by me and considered in my medical decision making (see chart for details).  Clinical Course     Pt with right flank pain.  No evidence of kidney stone on CT.  Possibly musculoskeletal.  NV intact.  No focal neuro deficits. Ambulates with steady gait.  No pelvic or abdominal pain on exam.  Pt is feeling better after pain medications.  CT results discussed with pt and all questions answered.  She appears stable for d/c and agrees to PMD f/u.  ER return precautions given.      Final Clinical Impressions(s) / ED Diagnoses   Final diagnoses:  Right flank pain    New Prescriptions Discharge Medication List as of 06/08/2016 10:35 PM    START taking these medications   Details  cyclobenzaprine (FLEXERIL) 10 MG tablet Take 1 tablet (10 mg total) by mouth 3 (three) times daily as needed., Starting Thu 06/08/2016, Print    naproxen (NAPROSYN) 500 MG tablet Take 1 tablet (500 mg total) by mouth 2 (two) times daily with a meal., Starting Thu 06/08/2016, Print         Landri Dorsainvil [place], PA-C 06/12/16 1610    Linwood Dibbles, MD 06/12/16 2156

## 2017-04-17 ENCOUNTER — Encounter: Payer: Self-pay | Admitting: Neurology

## 2017-04-17 ENCOUNTER — Ambulatory Visit (INDEPENDENT_AMBULATORY_CARE_PROVIDER_SITE_OTHER): Payer: Managed Care, Other (non HMO) | Admitting: Neurology

## 2017-04-17 VITALS — BP 123/86 | HR 87 | Ht 63.0 in | Wt 148.0 lb

## 2017-04-17 DIAGNOSIS — G4710 Hypersomnia, unspecified: Secondary | ICD-10-CM

## 2017-04-17 DIAGNOSIS — G4719 Other hypersomnia: Secondary | ICD-10-CM

## 2017-04-17 NOTE — Progress Notes (Signed)
Subjective:    Patient ID: Monique Ramsey is a 41 y.o. female.  HPI    Interim history:   Dear Monique Ramsey,  I saw your patient, Monique Ramsey, upon your kind request in my neurologic clinic today for reevaluation of her sleep disorder. The patient is unaccompanied today. I have seen her one time before over 2-1/2 years ago at the request of Monique Ramsey, at which time the patient reported a longer standing history of excessive sleepiness during the day and prolonged nighttime sleep. I ordered sleep studies at the time including a nocturnal polysomnogram followed by an MSLT next day but the patient did not have testing done at the time. I reviewed your office note from 01/25/2017, which you kindly included. She had a recent home sleep test on 04/02/2017 through your office which showed a total AHI of 13.5 per hour. Average oxygen saturation was 93%, nadir was 86%. Her Epworth sleepiness score is 9 out of 24 today, fatigue score is 35 out of 63.  She reports fairly unchanged symptoms of sleepiness and extended sleep at night. She has occasional morning headaches. She has no nocturia. She has had occasional sleep paralysis. She denies cataplexy. She is not sure if she snores. She does not drink alcohol, caffeine about 3 cans per day, no coffee, no tea. She has no family history of narcolepsy. She lives with her 2 sons. Her husband has a job that requires him to stay out of town for several weeks at a time. She feels like she could sleep 10 or 12 hours and does not wake up rested. She has a difficult time waking up in the mornings.  Previously:  07/06/2014: Monique Ramsey is a 41 year old right-handed woman with an underlying medical history of hypothyroidism, vitamin D deficiency, migraine headaches, and overweight state, who reports a long-standing history of sleeping excessively at night. Left her own devices she would easily sleep 12 hours or more each night if she could. She still doesn't wake up rested.  Usually she goes to bed around 10 PM and falls asleep within 15 minutes. She is a restless sleeper. She denies restless leg symptoms and frank leg twitching in her sleep. She is not aware of any significant snoring but has woken herself up with a sense of gasping for air. Her husband has never noted any pauses in her breathing. She has no family history of narcolepsy or obstructive sleep apnea. She denies any cataplexy, sleep paralysis, hypnagogic or hypnopompic hallucinations, and denies any parasomnias. She is tired during the day. She's not in the habit of taking any naps. She has never fallen asleep while driving. She denies any sleep attacks during the day. Her biggest complaint is that she sleeps too much at night and never wakes up rested. She denies nocturia but has morning headaches on occasion. Her Epworth sleepiness score is 10 out of 24. She denies depression and is not on any anxiety medication or antidepressant. She has been on prescription strength vitamin D, Synthroid and Imitrex as needed for her migraines. She had a sleep study some 7-10 years ago and says that she was told she did not sleep enough at night. She did not have a nap study at the time. She drinks 2 cans of soda per day. She does not smoke or drink alcohol.  Her Past Medical History Is Significant For: Past Medical History:  Diagnosis Date  . 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 Headache   . Hypothyroidism   .  Renal disorder    kidney stones    Her Past Surgical History Is Significant For: Past Surgical History:  Procedure Laterality Date  . CHOLECYSTECTOMY  2000  . fallopian tube removal  2014  . TUBAL LIGATION  2001    Her Family History Is Significant For: Family History  Problem Relation Age of Onset  . Multiple sclerosis Mother   . Diabetes Mellitus I Mother   . Migraines Neg Hx     Her Social History Is Significant For: Social History   Socioeconomic History  . Marital status:  Married    Spouse name: Monique Ramsey  . Number of children: 2  . Years of education: 67  . Highest education level: None  Social Needs  . Financial resource strain: None  . Food insecurity - worry: None  . Food insecurity - inability: None  . Transportation needs - medical: None  . Transportation needs - non-medical: None  Occupational History    Comment: not employed  Tobacco Use  . Smoking status: Never Smoker  . Smokeless tobacco: Never Used  Substance and Sexual Activity  . Alcohol use: No  . Drug use: No  . Sexual activity: None  Other Topics Concern  . None  Social History Narrative   Patient drinks 2-3 caffeine sodas a day.     Her Allergies Are:  No Known Allergies:   Her Current Medications Are:  Outpatient Encounter Medications as of 04/17/2017  Medication Sig  . SYNTHROID 88 MCG tablet Take 8 mcg by mouth daily.  . Vitamin D, Ergocalciferol, (DRISDOL) 50000 UNITS CAPS capsule 50,000 Units once a week.  . cyclobenzaprine (FLEXERIL) 10 MG tablet Take 1 tablet (10 mg total) by mouth 3 (three) times daily as needed.  . naproxen (NAPROSYN) 500 MG tablet Take 1 tablet (500 mg total) by mouth 2 (two) times daily with a meal.   No facility-administered encounter medications on file as of 04/17/2017.   :  Review of Systems:  Out of a complete 14 point review of systems, all are reviewed and negative with the exception of these symptoms as listed below: Review of Systems  Neurological:       Pt presents today to discuss her sleep. Pt has difficulty waking up and getting going in the morning. Pt recently had a HST through her PCP. Pt did not complete the in-lab sleep studies as ordered in 2016. Pt does not believe that she snores. Epworth Sleepiness Scale 0= would never doze 1= slight chance of dozing 2= moderate chance of dozing 3= high chance of dozing  Sitting and reading: 0 Watching TV: 3 Sitting inactive in a public place (ex. Theater or meeting): 0 As a passenger  in a car for an hour without a break: 3 Lying down to rest in the afternoon: 3 Sitting and talking to someone: 0 Sitting quietly after lunch (no alcohol): 0 In a car, while stopped in traffic: 0 Total: 9     Objective:  Neurological Exam  Physical Exam Physical Examination:   Vitals:   04/17/17 1354  BP: 123/86  Pulse: 87   General Examination: The patient is a very pleasant 41 y.o. female in no acute distress. She appears well-developed and well-nourished and well groomed.   HEENT: Normocephalic, atraumatic, pupils are equal, round and reactive to light and accommodation. Extraocular tracking is good without limitation to gaze excursion or nystagmus noted. Normal smooth pursuit is noted. Hearing is grossly intact. Face is symmetric with normal facial animation and  normal facial sensation. Speech is clear with no dysarthria noted. There is no hypophonia. There is no lip, neck/head, jaw or voice tremor. Neck is supple with full range of passive and active motion. There are no carotid bruits on auscultation. Oropharynx exam reveals: mild mouth dryness, good dental hygiene and mild airway crowding, due to narrow airway entry and tonsils in place. She has mild pharyngeal erythema. Mallampati is class II. Neck circumference is 13 inches.  Chest: Clear to auscultation without wheezing, rhonchi or crackles noted.  Heart: S1+S2+0, regular and normal without murmurs, rubs or gallops noted.   Abdomen: Soft, non-tender and non-distended with normal bowel sounds appreciated on auscultation.  Extremities: There is no pitting edema in the distal lower extremities bilaterally. Pedal pulses are intact.  Skin: Warm and dry without trophic changes noted. There are no varicose veins.  Musculoskeletal: exam reveals no obvious joint deformities, tenderness or joint swelling or erythema.   Neurologically:  Mental status: The patient is awake, alert and oriented in all 4 spheres. Her immediate  and remote memory, attention, language skills and fund of knowledge are appropriate. There is no evidence of aphasia, agnosia, apraxia or anomia. Speech is clear with normal prosody and enunciation. Thought process is linear. Mood is normal and affect is normal.  Cranial nerves II - XII are as described above under HEENT exam. In addition: shoulder shrug is normal with equal shoulder height noted. Motor exam: Normal bulk, strength and tone is noted. There is no drift, tremor or rebound. Romberg is negative. Reflexes are 2+ throughout. Fine motor skills and coordination are grossly intact.  Cerebellar testing: No dysmetria or intention tremor. There is no truncal or gait ataxia.  Sensory exam: intact to light touch in the upper and lower extremities.  Gait, station and balance: She stands easily. No veering to one side is noted. No leaning to one side is noted. Posture is age-appropriate and stance is narrow based. Gait shows normal stride length and normal pace. No problems turning are noted.                Assessment and Plan:  In summary, Loyal Gamblermanda C Yagi is a very pleasant 41 year old female with an underlying medical history of hypothyroidism, vitamin D deficiency, migraine headaches, and overweight state, who presents for reevaluation of her daytime somnolence, difficulty waking up, prolonged nighttime sleep. She had a recent home sleep test which suggested mild obstructive sleep apnea, nevertheless it does not provide enough of the information given her long-standing history of sleepiness and history concerning for underlying sleepiness disorder such as narcolepsy without cataplexy or idiopathic hypersomnolence. I do believe we need to proceed with an attendant sleep study at this point, followed by a next a MSLT. She reports that she was given a prescription recently by you for a medication. She has not picked up the prescription yet. She is advised to hold off on any new medications especially  antidepressant or wake promoting medication. Her physical exam continues to be nonfocal which is reassuring. I would like for the patient to come back for further testing in the form of nocturnal polysomnogram followed by a nap study the next day. I explained the sleep test procedures to her in detail. She does not have to wean any medications at this time. I will see her back after the sleep studies are completed. I answered all her questions today and the patient was in agreement.  Thank you very much for allowing me to participate in  the care of this nice patient. If I can be of any further assistance to you please do not hesitate to call us at [phone].   Sincerely,   Huston FoleySaima Avaiah Stempel, MD, PhD

## 2017-04-17 NOTE — Patient Instructions (Addendum)
We will try to look into your severe sleepiness with a nighttime sleep study, followed by a daytime nap study. In preparation for sleep study testing, you cannot be on any wake promoting agent of antidepressant. Please discuss with your PCP, what medication they prescribed for you. Please let us know, but you may want to hold off on it.  Your recent home sleep test from your PCP's office shows evidence of mild OSA. An attended sleep study would help rule out sleep apnea and will give us more insight into your longstanding sleepiness issues.

## 2017-11-27 IMAGING — DX DG CHEST 2V
2 series · 2 of 2 positions shown · non-contrast
Comparison: Portable chest x-ray June 20, 2007

CLINICAL DATA: Nonproductive cough for the past 12 days, diagnosis
with influenza 9 days ago, nonsmoker.

EXAM:
CHEST  2 VIEW

[chest pa]
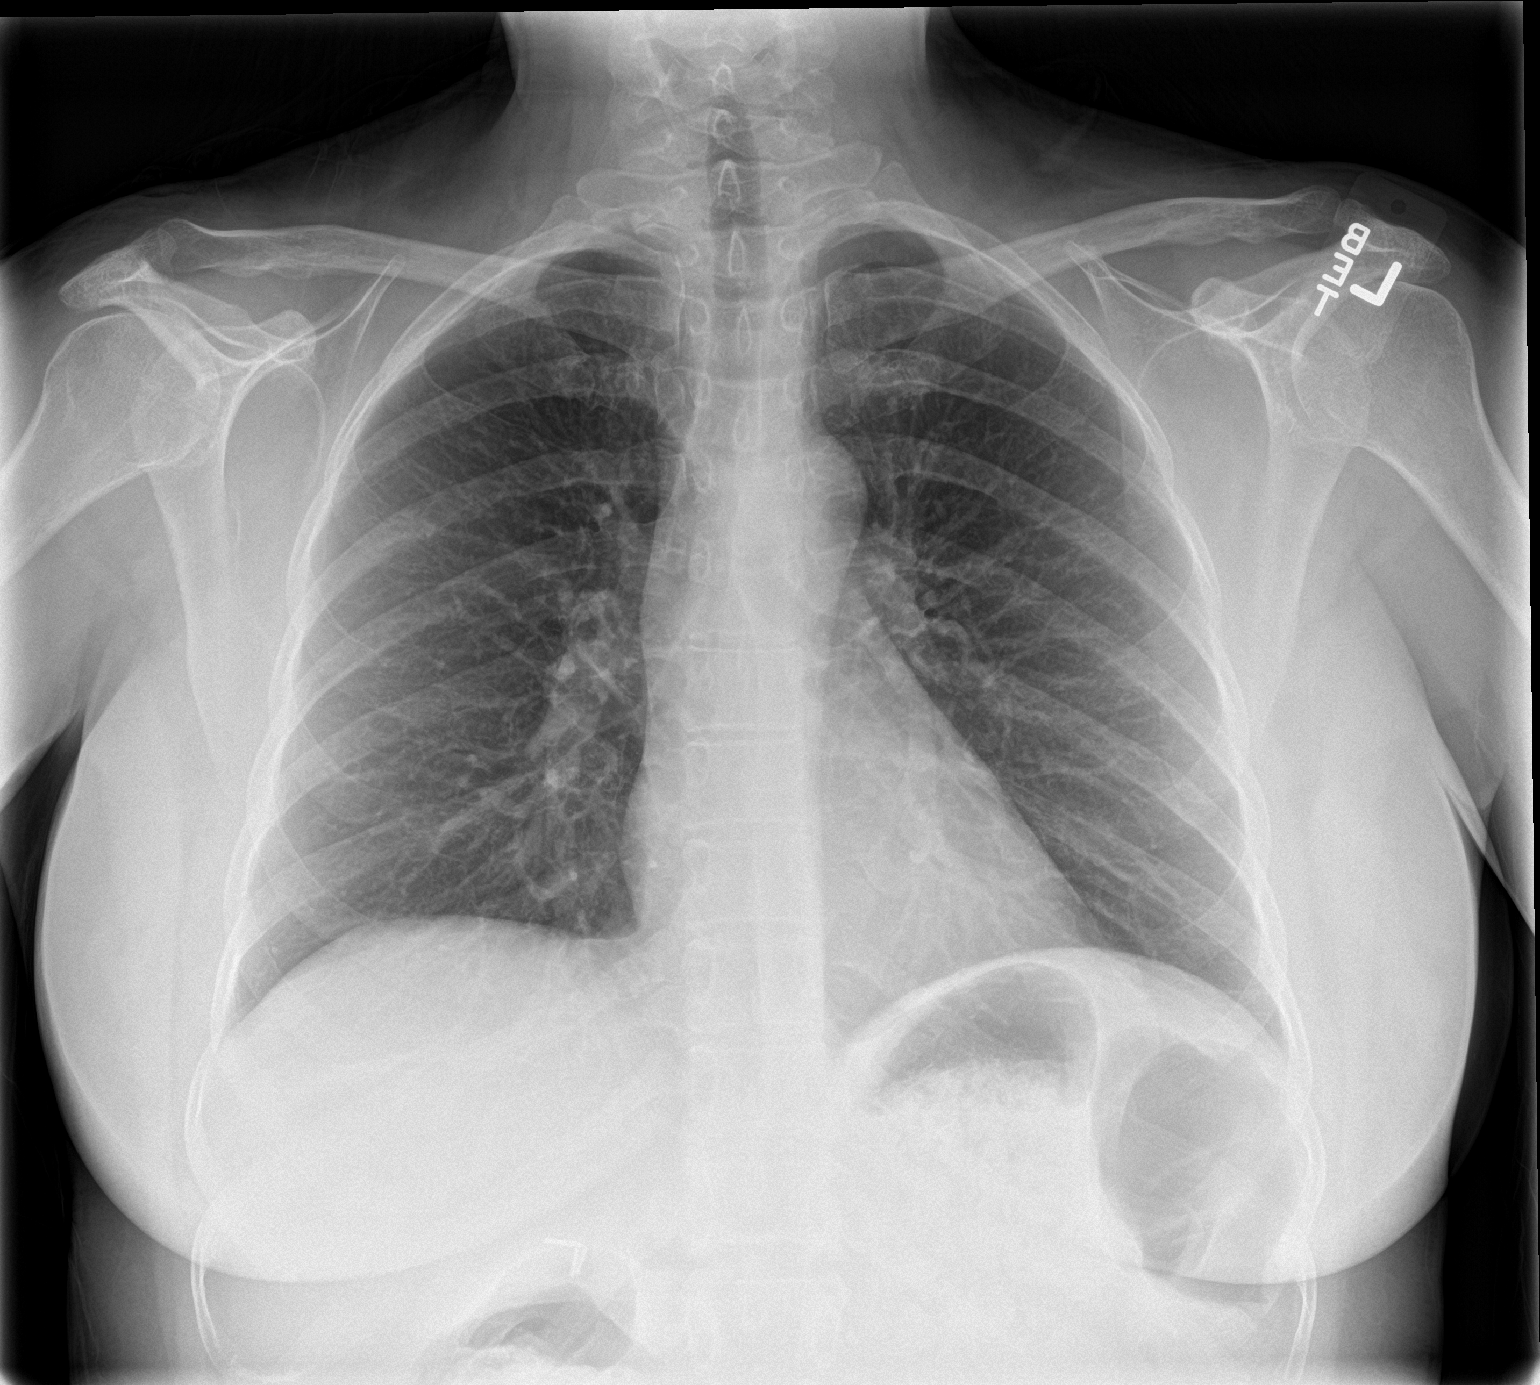

[chest lat]
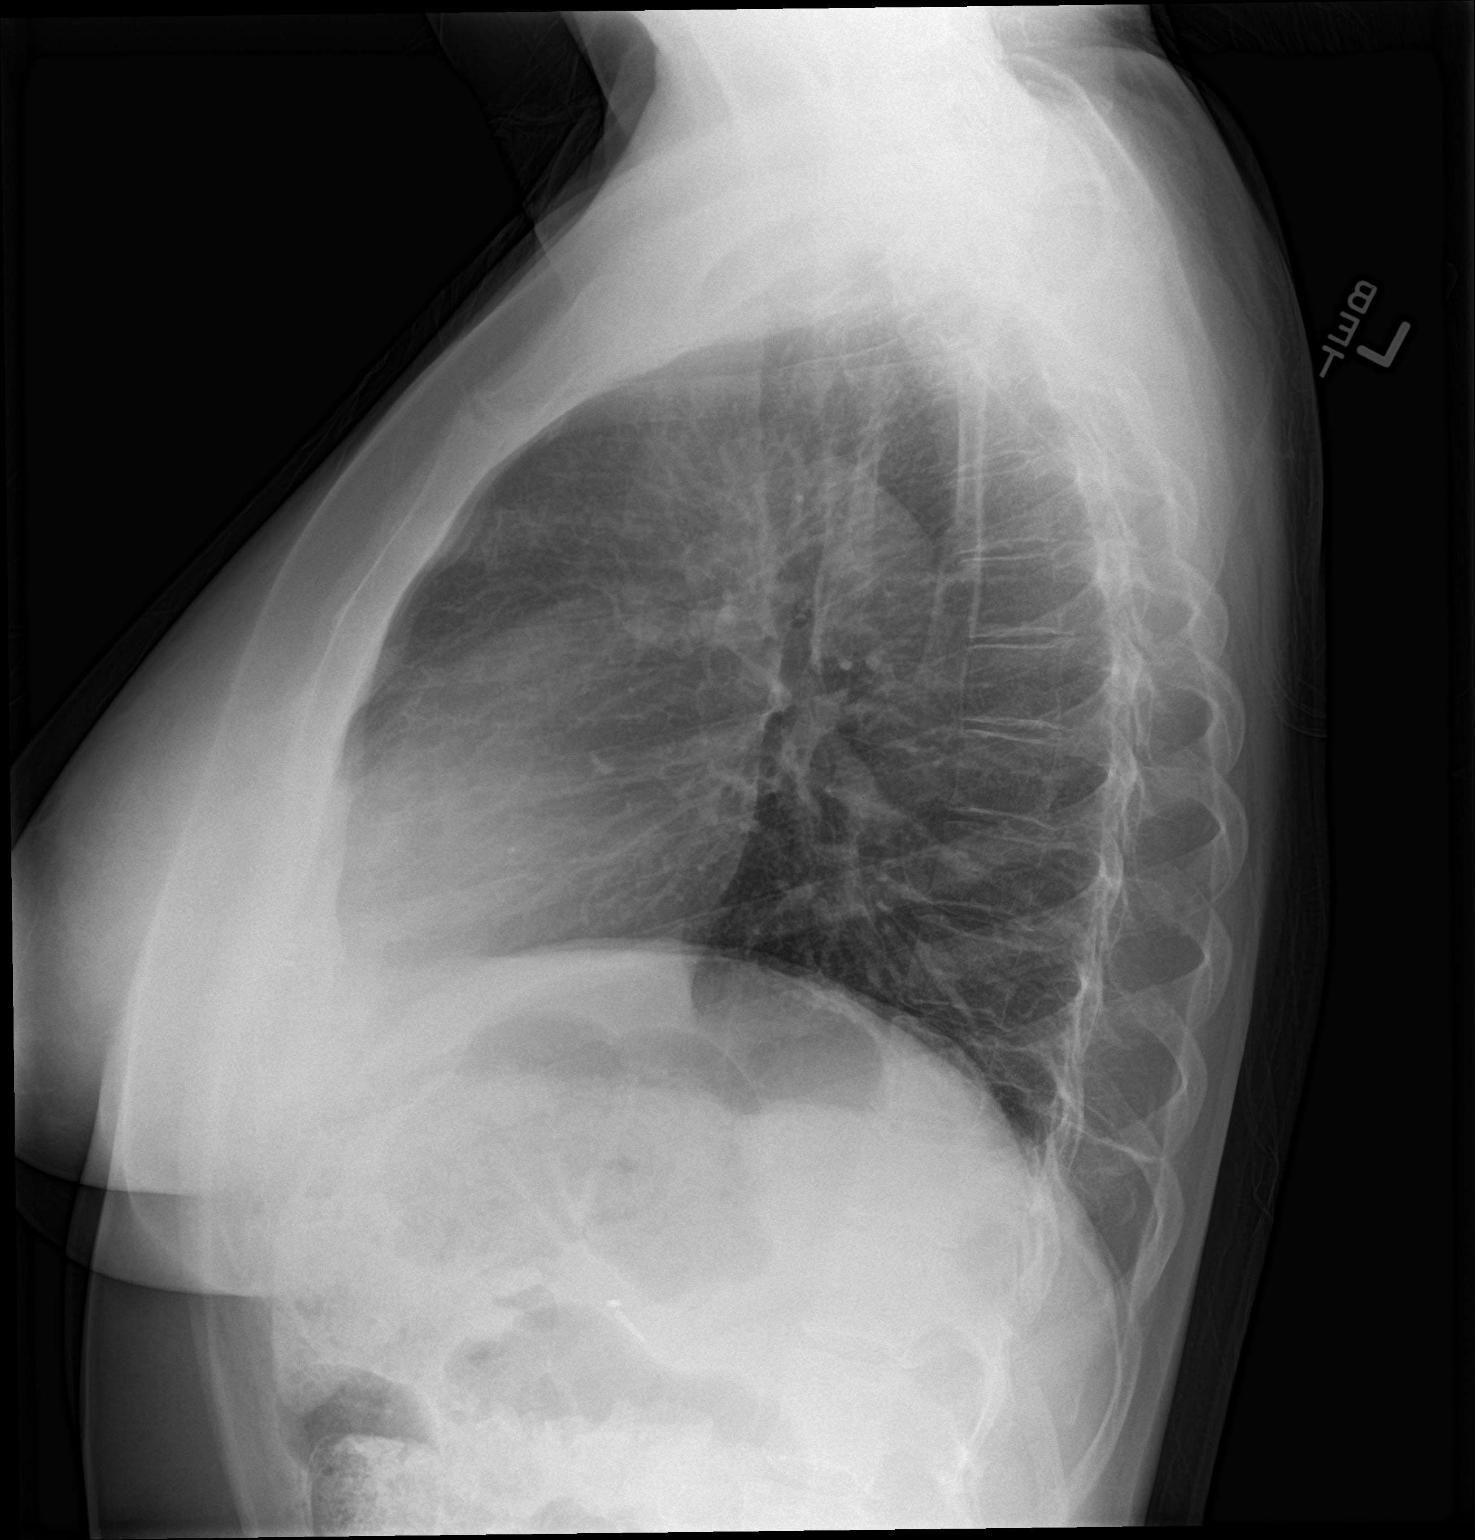

[2 of 2 positions shown; findings below may reference images not displayed]

FINDINGS: The lungs are adequately inflated. The interstitial markings are
coarse especially in the left lower lobe posteriorly. There is no
alveolar infiltrate. There is no pleural effusion. The heart and
mediastinal structures are normal. The bony thorax is unremarkable.
IMPRESSION: Subsegmental atelectasis in the left lower lobe. There is no
alveolar pneumonia.

## 2018-10-04 IMAGING — CT CT RENAL STONE PROTOCOL
2 of 4 series · 16 of 46 positions shown, 18 images · non-contrast
Comparison: 11/03/2013

CLINICAL DATA: Right-sided flank pain starting 2 days ago.

EXAM:
CT ABDOMEN AND PELVIS WITHOUT CONTRAST
TECHNIQUE: Multidetector CT imaging of the abdomen and pelvis was performed
following the standard protocol without IV contrast.

[Series 2: axial st · axial · 0.79mm/px · z∈[+754,+1174]mm · 13 of 94 slices shown, 15 images]
[im 5/94  soft-tissue]
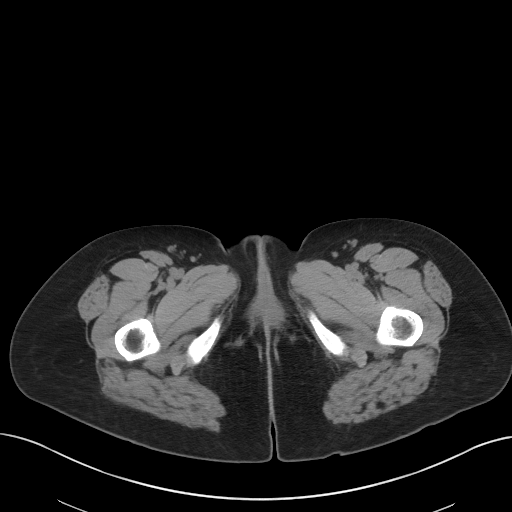
[im 5/94  bone]
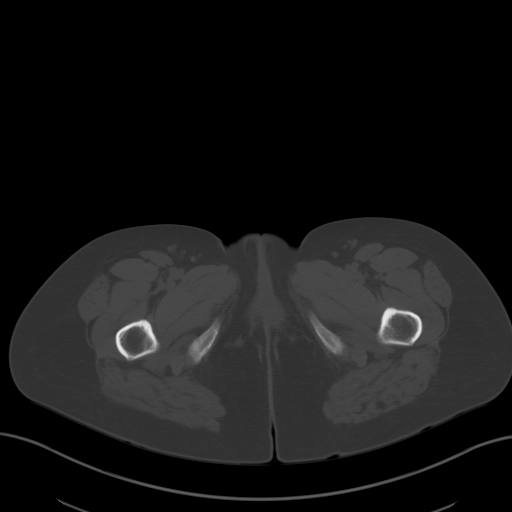
[im 13/94  soft-tissue]
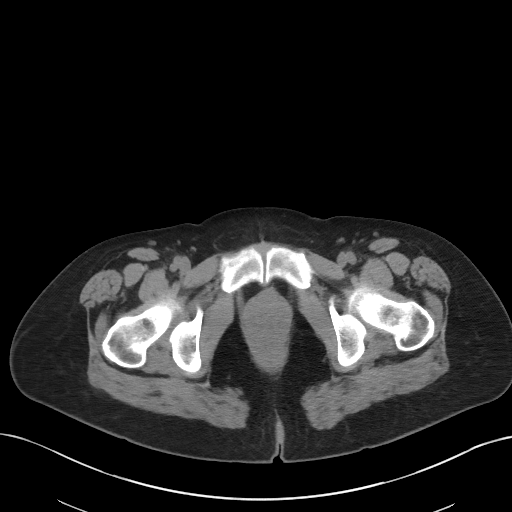
[im 21/94  soft-tissue]
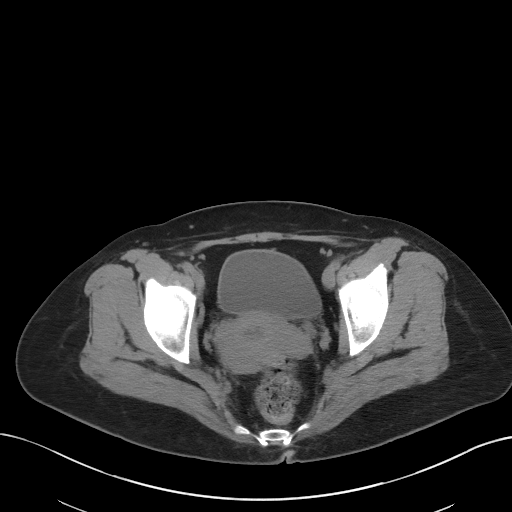
[im 25/94  soft-tissue]
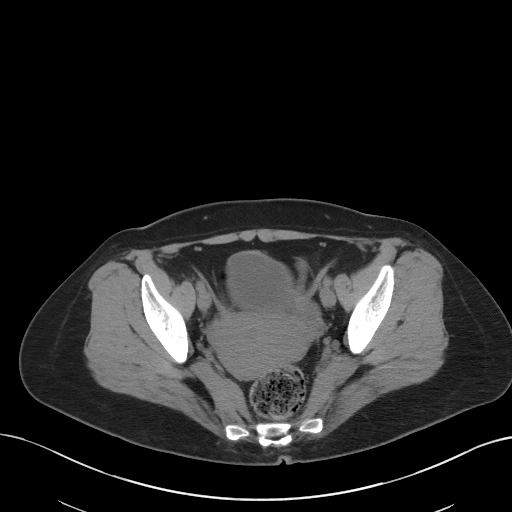
[im 33/94  soft-tissue]
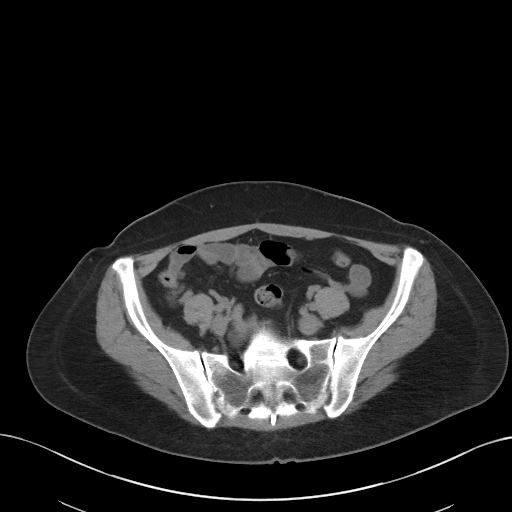
[im 41/94  soft-tissue]
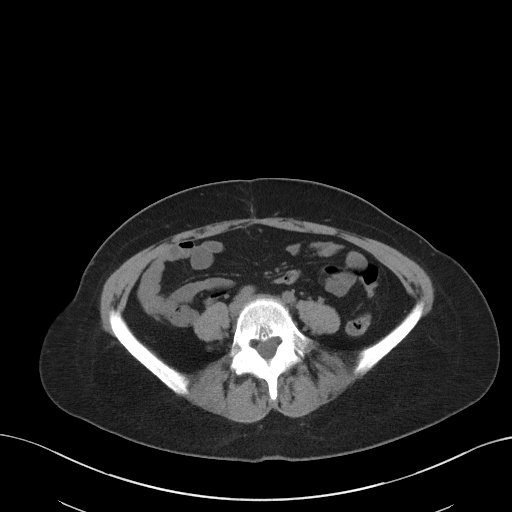
[im 49/94  soft-tissue]
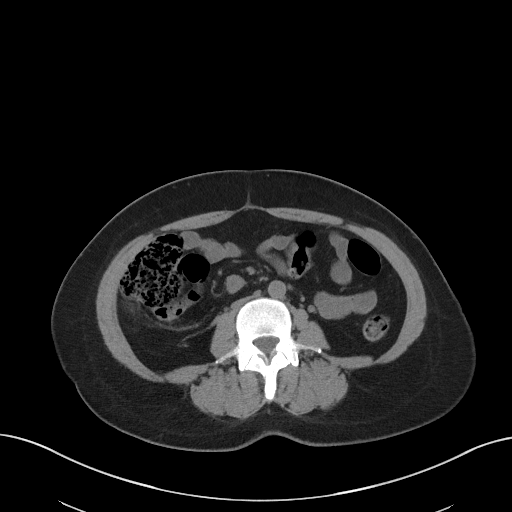
[im 53/94  soft-tissue]
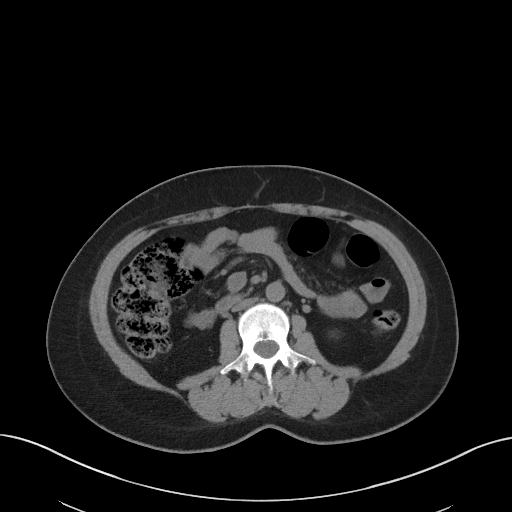
[im 61/94  soft-tissue]
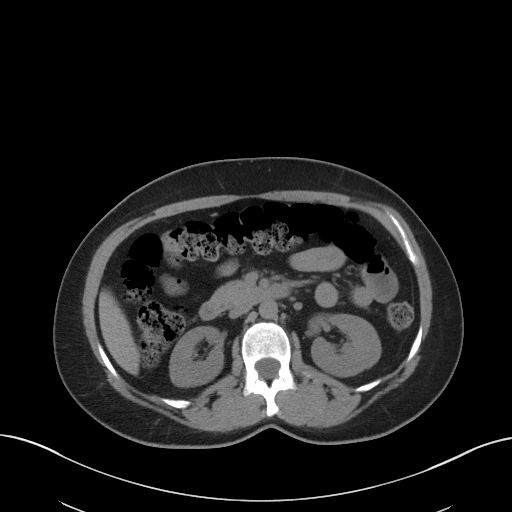
[im 61/94  bone]
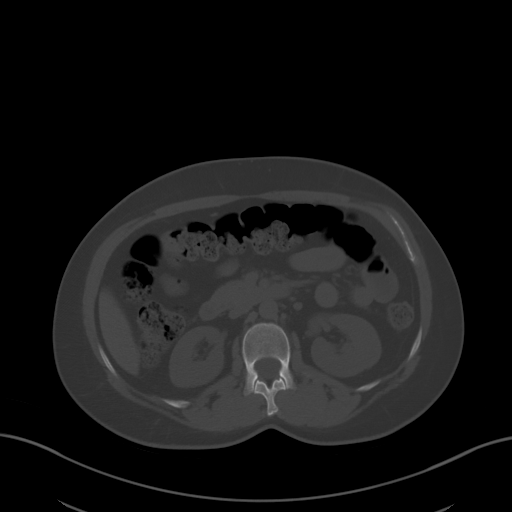
[im 69/94  soft-tissue]
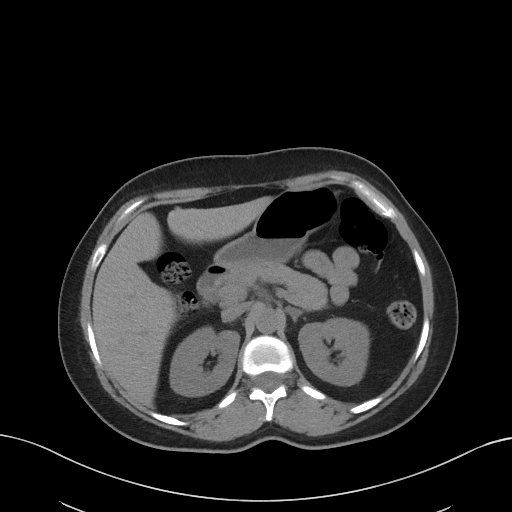
[im 73/94  soft-tissue]
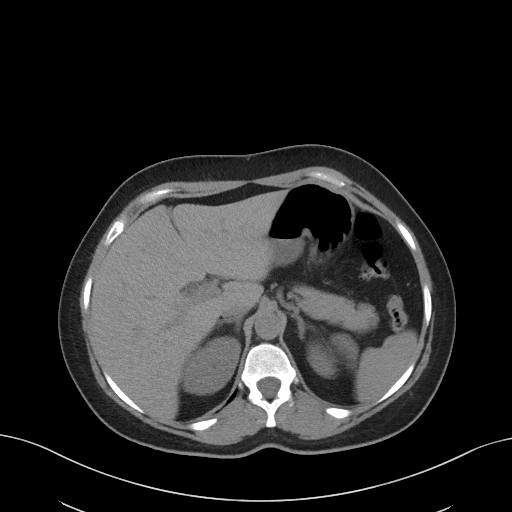
[im 81/94  soft-tissue]
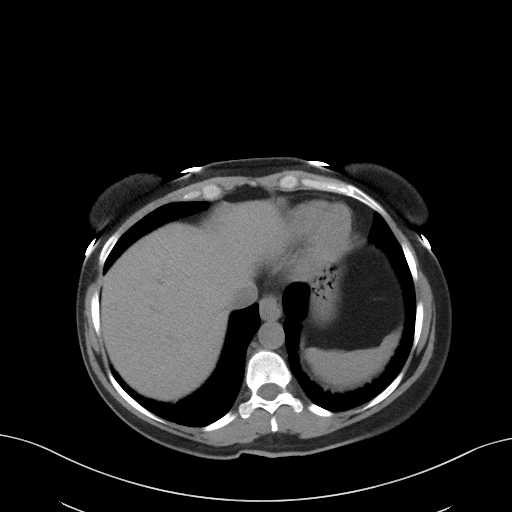
[im 89/94  soft-tissue]
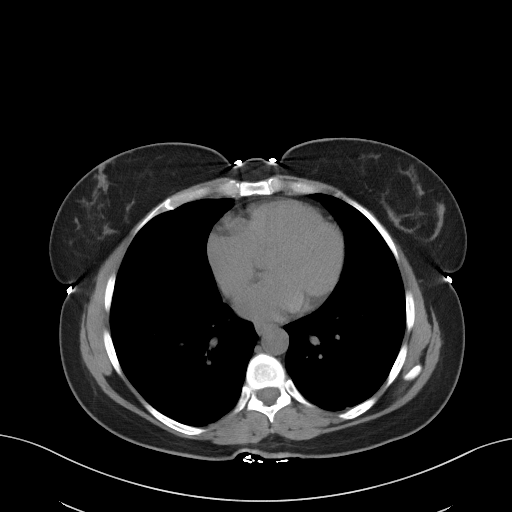

[Series 3: coronal st · coronal · 0.69mm/px · 3 of 85 slices shown]
[im 29/85  soft-tissue]
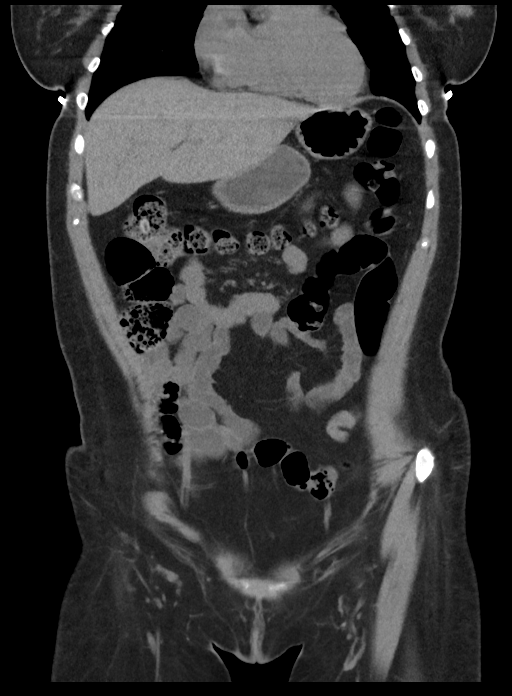
[im 38/85  soft-tissue]
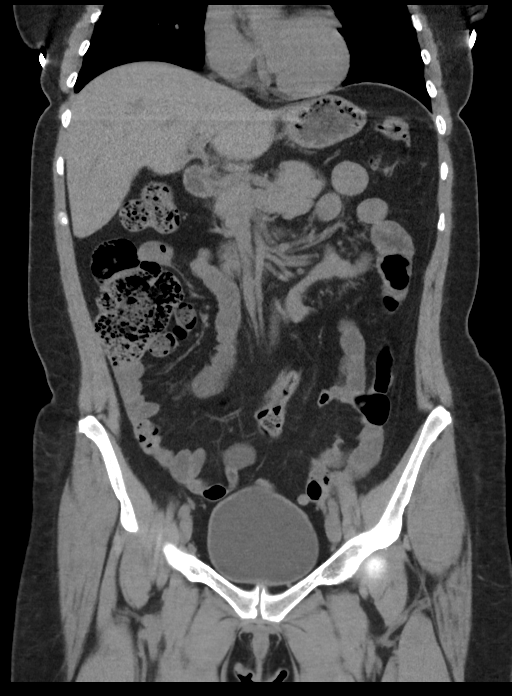
[im 47/85  soft-tissue]
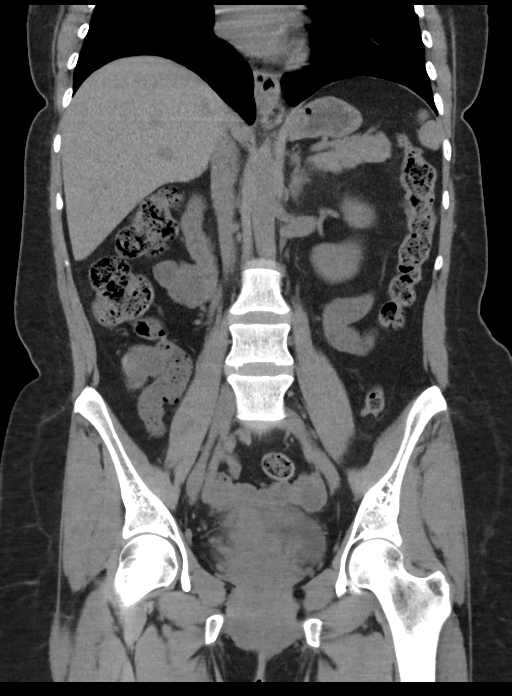

[16 of 46 positions shown; findings below may reference images not displayed]

FINDINGS: Lower chest: No acute abnormality.  Small hiatal hernia.

Hepatobiliary: No focal liver abnormality is seen. Status post
cholecystectomy. No biliary dilatation.

Pancreas: Unremarkable. No pancreatic ductal dilatation or
surrounding inflammatory changes.

Spleen: Normal in size without focal abnormality.

Adrenals/Urinary Tract: Normal appearance of the adrenal glands.
Normal cortical thickness of the kidneys. There are bilateral
nonobstructive tiny renal calculi, the largest of which in the upper
pole of the right kidney measures 3 mm. No evidence of
hydronephrosis.

Stomach/Bowel: Stomach is within normal limits. No evidence of
appendicitis. No evidence of bowel wall thickening, distention, or
inflammatory changes.

Vascular/Lymphatic: No significant vascular findings are present. No
enlarged abdominal or pelvic lymph nodes.

Reproductive: No evidence of adnexal masses. Ill-defined
hypoattenuated area within the lower uterine segment.

Other: No abdominal wall hernia or abnormality. No abdominopelvic
ascites.

Musculoskeletal: No acute or significant osseous findings.
IMPRESSION: No evidence obstructive uropathy.

Bilateral nonobstructive nephrolithiasis.

Ill-defined hypoattenuated area within the lower uterine segment,
which may be further evaluated with pelvic ultrasound.
